# Patient Record
Sex: Female | Born: 1975 | Hispanic: Yes | Marital: Married | Smoking: Never smoker
Health system: Southern US, Community
[De-identification: ages and names within clinical notes are randomized; demographics above are authoritative.]

---

## 2014-07-25 ENCOUNTER — Ambulatory Visit (INDEPENDENT_AMBULATORY_CARE_PROVIDER_SITE_OTHER): Payer: 59 | Admitting: Women's Health

## 2014-07-25 ENCOUNTER — Encounter: Payer: Self-pay | Admitting: Women's Health

## 2014-07-25 ENCOUNTER — Other Ambulatory Visit (HOSPITAL_COMMUNITY)
Admission: RE | Admit: 2014-07-25 | Discharge: 2014-07-25 | Disposition: A | Discharge status: Home / Self Care | Payer: 59 | Source: Ambulatory Visit | Attending: Women's Health | Admitting: Women's Health

## 2014-07-25 VITALS — BP 140/80 | Ht 61.0 in | Wt 120.0 lb

## 2014-07-25 DIAGNOSIS — N979 Female infertility, unspecified: Secondary | ICD-10-CM | POA: Diagnosis not present

## 2014-07-25 DIAGNOSIS — N926 Irregular menstruation, unspecified: Secondary | ICD-10-CM

## 2014-07-25 DIAGNOSIS — N946 Dysmenorrhea, unspecified: Secondary | ICD-10-CM | POA: Diagnosis not present

## 2014-07-25 DIAGNOSIS — Z1151 Encounter for screening for human papillomavirus (HPV): Secondary | ICD-10-CM | POA: Insufficient documentation

## 2014-07-25 DIAGNOSIS — Z833 Family history of diabetes mellitus: Secondary | ICD-10-CM

## 2014-07-25 DIAGNOSIS — Z01419 Encounter for gynecological examination (general) (routine) without abnormal findings: Secondary | ICD-10-CM | POA: Insufficient documentation

## 2014-07-25 DIAGNOSIS — Z13220 Encounter for screening for lipoid disorders: Secondary | ICD-10-CM

## 2014-07-25 DIAGNOSIS — R87810 Cervical high risk human papillomavirus (HPV) DNA test positive: Secondary | ICD-10-CM | POA: Insufficient documentation

## 2014-07-25 LAB — CBC WITH DIFFERENTIAL/PLATELET
Basophils Absolute: 0.1 10*3/uL (ref 0.0–0.1)
Basophils Relative: 1 % (ref 0–1)
Eosinophils Absolute: 0.6 10*3/uL (ref 0.0–0.7)
Eosinophils Relative: 7 % — ABNORMAL HIGH (ref 0–5)
HCT: 38.5 % (ref 36.0–46.0)
HEMOGLOBIN: 13 g/dL (ref 12.0–15.0)
LYMPHS ABS: 2.1 10*3/uL (ref 0.7–4.0)
Lymphocytes Relative: 23 % (ref 12–46)
MCH: 28.7 pg (ref 26.0–34.0)
MCHC: 33.8 g/dL (ref 30.0–36.0)
MCV: 85 fL (ref 78.0–100.0)
MONOS PCT: 8 % (ref 3–12)
MPV: 9.6 fL (ref 8.6–12.4)
Monocytes Absolute: 0.7 10*3/uL (ref 0.1–1.0)
NEUTROS ABS: 5.5 10*3/uL (ref 1.7–7.7)
NEUTROS PCT: 61 % (ref 43–77)
Platelets: 333 10*3/uL (ref 150–400)
RBC: 4.53 MIL/uL (ref 3.87–5.11)
RDW: 14.2 % (ref 11.5–15.5)
WBC: 9 10*3/uL (ref 4.0–10.5)

## 2014-07-25 LAB — LIPID PANEL
CHOLESTEROL: 131 mg/dL (ref 0–200)
HDL: 37 mg/dL — ABNORMAL LOW (ref >=46)
LDL CALC: 79 mg/dL (ref 0–99)
Total CHOL/HDL Ratio: 3.5 Ratio
Triglycerides: 74 mg/dL (ref <150)
VLDL: 15 mg/dL (ref 0–40)

## 2014-07-25 LAB — TSH: TSH: 0.804 u[IU]/mL (ref 0.350–4.500)

## 2014-07-25 LAB — GLUCOSE, RANDOM: Glucose, Bld: 84 mg/dL (ref 70–99)

## 2014-07-25 MED ORDER — DOXYCYCLINE HYCLATE 50 MG PO CAPS: ORAL_CAPSULE | ORAL | Status: DC

## 2014-07-25 MED ORDER — IBUPROFEN 600 MG PO TABS: 600.0000 mg | ORAL_TABLET | Freq: Three times a day (TID) | ORAL | Status: DC | PRN

## 2014-07-25 NOTE — Patient Instructions (Signed)
Health Maintenance Adopting a healthy lifestyle and getting preventive care can go a long way to promote health and wellness. Talk with your health care provider about what schedule of regular examinations is right for you. This is a good chance for you to check in with your provider about disease prevention and staying healthy. In between checkups, there are plenty of things you can do on your own. Experts have done a lot of research about which lifestyle changes and preventive measures are most likely to keep you healthy. Ask your health care provider for more information. WEIGHT AND DIET  Eat a healthy diet  Be sure to include plenty of vegetables, fruits, low-fat dairy products, and lean protein.  Do not eat a lot of foods high in solid fats, added sugars, or salt.  Get regular exercise. This is one of the most important things you can do for your health.  Most adults should exercise for at least 150 minutes each week. The exercise should increase your heart rate and make you sweat (moderate-intensity exercise).  Most adults should also do strengthening exercises at least twice a week. This is in addition to the moderate-intensity exercise.  Maintain a healthy weight  Body mass index (BMI) is a measurement that can be used to identify possible weight problems. It estimates body fat based on height and weight. Your health care provider can help determine your BMI and help you achieve or maintain a healthy weight.  For females 25 years of age and older:   A BMI below 18.5 is considered underweight.  A BMI of 18.5 to 24.9 is normal.  A BMI of 25 to 29.9 is considered overweight.  A BMI of 30 and above is considered obese.  Watch levels of cholesterol and blood lipids  You should start having your blood tested for lipids and cholesterol at 39 years of age, then have this test every 5 years.  You may need to have your cholesterol levels checked more often if:  Your lipid or  cholesterol levels are high.  You are older than 39 years of age.  You are at high risk for heart disease.  CANCER SCREENING   Lung Cancer  Lung cancer screening is recommended for adults 97-92 years old who are at high risk for lung cancer because of a history of smoking.  A yearly low-dose CT scan of the lungs is recommended for people who:  Currently smoke.  Have quit within the past 15 years.  Have at least a 30-pack-year history of smoking. A pack year is smoking an average of one pack of cigarettes a day for 1 year.  Yearly screening should continue until it has been 15 years since you quit.  Yearly screening should stop if you develop a health problem that would prevent you from having lung cancer treatment.  Breast Cancer  Practice breast self-awareness. This means understanding how your breasts normally appear and feel.  It also means doing regular breast self-exams. Let your health care provider know about any changes, no matter how small.  If you are in your 20s or 30s, you should have a clinical breast exam (CBE) by a health care provider every 1-3 years as part of a regular health exam.  If you are 76 or older, have a CBE every year. Also consider having a breast X-ray (mammogram) every year.  If you have a family history of breast cancer, talk to your health care provider about genetic screening.  If you are  at high risk for breast cancer, talk to your health care provider about having an MRI and a mammogram every year.  Breast cancer gene (BRCA) assessment is recommended for women who have family members with BRCA-related cancers. BRCA-related cancers include:  Breast.  Ovarian.  Tubal.  Peritoneal cancers.  Results of the assessment will determine the need for genetic counseling and BRCA1 and BRCA2 testing. Cervical Cancer Routine pelvic examinations to screen for cervical cancer are no longer recommended for nonpregnant women who are considered low  risk for cancer of the pelvic organs (ovaries, uterus, and vagina) and who do not have symptoms. A pelvic examination may be necessary if you have symptoms including those associated with pelvic infections. Ask your health care provider if a screening pelvic exam is right for you.   The Pap test is the screening test for cervical cancer for women who are considered at risk.  If you had a hysterectomy for a problem that was not cancer or a condition that could lead to cancer, then you no longer need Pap tests.  If you are older than 65 years, and you have had normal Pap tests for the past 10 years, you no longer need to have Pap tests.  If you have had past treatment for cervical cancer or a condition that could lead to cancer, you need Pap tests and screening for cancer for at least 20 years after your treatment.  If you no longer get a Pap test, assess your risk factors if they change (such as having a new sexual partner). This can affect whether you should start being screened again.  Some women have medical problems that increase their chance of getting cervical cancer. If this is the case for you, your health care provider may recommend more frequent screening and Pap tests.  The human papillomavirus (HPV) test is another test that may be used for cervical cancer screening. The HPV test looks for the virus that can cause cell changes in the cervix. The cells collected during the Pap test can be tested for HPV.  The HPV test can be used to screen women 30 years of age and older. Getting tested for HPV can extend the interval between normal Pap tests from three to five years.  An HPV test also should be used to screen women of any age who have unclear Pap test results.  After 39 years of age, women should have HPV testing as often as Pap tests.  Colorectal Cancer  This type of cancer can be detected and often prevented.  Routine colorectal cancer screening usually begins at 39 years of  age and continues through 39 years of age.  Your health care provider may recommend screening at an earlier age if you have risk factors for colon cancer.  Your health care provider may also recommend using home test kits to check for hidden blood in the stool.  A small camera at the end of a tube can be used to examine your colon directly (sigmoidoscopy or colonoscopy). This is done to check for the earliest forms of colorectal cancer.  Routine screening usually begins at age 50.  Direct examination of the colon should be repeated every 5-10 years through 39 years of age. However, you may need to be screened more often if early forms of precancerous polyps or small growths are found. Skin Cancer  Check your skin from head to toe regularly.  Tell your health care provider about any new moles or changes in   moles, especially if there is a change in a mole's shape or color.  Also tell your health care provider if you have a mole that is larger than the size of a pencil eraser.  Always use sunscreen. Apply sunscreen liberally and repeatedly throughout the day.  Protect yourself by wearing long sleeves, pants, a wide-brimmed hat, and sunglasses whenever you are outside. HEART DISEASE, DIABETES, AND HIGH BLOOD PRESSURE   Have your blood pressure checked at least every 1-2 years. High blood pressure causes heart disease and increases the risk of stroke.  If you are between 75 years and 42 years old, ask your health care provider if you should take aspirin to prevent strokes.  Have regular diabetes screenings. This involves taking a blood sample to check your fasting blood sugar level.  If you are at a normal weight and have a low risk for diabetes, have this test once every three years after 39 years of age.  If you are overweight and have a high risk for diabetes, consider being tested at a younger age or more often. PREVENTING INFECTION  Hepatitis B  If you have a higher risk for  hepatitis B, you should be screened for this virus. You are considered at high risk for hepatitis B if:  You were born in a country where hepatitis B is common. Ask your health care provider which countries are considered high risk.  Your parents were born in a high-risk country, and you have not been immunized against hepatitis B (hepatitis B vaccine).  You have HIV or AIDS.  You use needles to inject street drugs.  You live with someone who has hepatitis B.  You have had sex with someone who has hepatitis B.  You get hemodialysis treatment.  You take certain medicines for conditions, including cancer, organ transplantation, and autoimmune conditions. Hepatitis C  Blood testing is recommended for:  Everyone born from [birth year] through 1965.  Anyone with known risk factors for hepatitis C. Sexually transmitted infections (STIs)  You should be screened for sexually transmitted infections (STIs) including gonorrhea and chlamydia if:  You are sexually active and are younger than 39 years of age.  You are older than 39 years of age and your health care provider tells you that you are at risk for this type of infection.  Your sexual activity has changed since you were last screened and you are at an increased risk for chlamydia or gonorrhea. Ask your health care provider if you are at risk.  If you do not have HIV, but are at risk, it may be recommended that you take a prescription medicine daily to prevent HIV infection. This is called pre-exposure prophylaxis (PrEP). You are considered at risk if:  You are sexually active and do not regularly use condoms or know the HIV status of your partner(s).  You take drugs by injection.  You are sexually active with a partner who has HIV. Talk with your health care provider about whether you are at high risk of being infected with HIV. If you choose to begin PrEP, you should first be tested for HIV. You should then be tested every 3 months for  as long as you are taking PrEP.  PREGNANCY   If you are premenopausal and you may become pregnant, ask your health care provider about preconception counseling.  If you may become pregnant, take 400 to 800 micrograms (mcg) of folic acid every day.  If you want to prevent pregnancy, talk to your  health care provider about birth control (contraception). OSTEOPOROSIS AND MENOPAUSE   Osteoporosis is a disease in which the bones lose minerals and strength with aging. This can result in serious bone fractures. Your risk for osteoporosis can be identified using a bone density scan.  If you are 73 years of age or older, or if you are at risk for osteoporosis and fractures, ask your health care provider if you should be screened.  Ask your health care provider whether you should take a calcium or vitamin D supplement to lower your risk for osteoporosis.  Menopause may have certain physical symptoms and risks.  Hormone replacement therapy may reduce some of these symptoms and risks. Talk to your health care provider about whether hormone replacement therapy is right for you.  HOME CARE INSTRUCTIONS   Schedule regular health, dental, and eye exams.  Stay current with your immunizations.   Do not use any tobacco products including cigarettes, chewing tobacco, or electronic cigarettes.  If you are pregnant, do not drink alcohol.  If you are breastfeeding, limit how much and how often you drink alcohol.  Limit alcohol intake to no more than 1 drink per day for nonpregnant women. One drink equals 12 ounces of beer, 5 ounces of wine, or 1 ounces of hard liquor.  Do not use street drugs.  Do not share needles.  Ask your health care provider for help if you need support or information about quitting drugs.  Tell your health care provider if you often feel depressed.  Tell your health care provider if you have ever been abused or do not feel safe at home. Document Released: 10/19/2010  Document Revised: 08/20/2013 Document Reviewed: 03/07/2013 [facility] Patient Information 2015 [place], [place]. This information is not intended to replace advice given to you by your health care provider. Make sure you discuss any questions you have with your health care provider. Hysterosalpingography Hysterosalpingography is a procedure to look inside your uterus and fallopian tubes. During this procedure, contrast dye is injected into your uterus through your vagina and cervix to illuminate your uterus while X-ray pictures are taken. This procedure may help your health care provider determine whether you have uterine tumors, adhesions, or structural abnormalities. It is commonly used to help determine why a woman is unable to have children (infertility). The procedure usually lasts about 15-30 minutes. LET [facility] CARE PROVIDER KNOW ABOUT:  Any allergies you have.  All medicines you are taking, including vitamins, herbs, eye drops, creams, and over-the-counter medicines.  Previous problems you or members of your family have had with the use of anesthetics.  Any blood disorders you have.  Previous surgeries you have had.  Medical conditions you have. RISKS AND COMPLICATIONS  Generally, this is a safe procedure. However, as with any procedure, problems can occur. Possible problems include:  Infection in the lining of the uterus (endometritis) or fallopian tubes (salpingitis).  Damage or perforation of the uterus or fallopian tubes.  An allergic reaction to the contrast dye used to perform the X-ray. BEFORE THE PROCEDURE   Schedule the procedure after your period stops, but before your next ovulation. This is usually between day 5 and day 10 of your last period. Day 1 is the first day of your period.  Ask your health care provider about changing or stopping your regular medicines.  You may eat and drink as normal.  Empty your bladder before the procedure begins. PROCEDURE  You  may be given a medicine to relax you (  sedative) or an over-the-counter pain medicine to lessen any discomfort during the procedure.  You will lie down on an X-ray table with your feet in stirrups.  A device called a speculum will be placed into your vagina. This allows your health care provider to see inside your vagina to the cervix.  The cervix will be washed with a special soap.  A thin, flexible tube will be passed through the cervix into the uterus.  Contrast dye will be put into this tube.  Several X-rays will be taken as the contrast dye spreads through the uterus and fallopian tubes.  The tube will be taken out after the procedure. AFTER THE PROCEDURE   Most of the contrast dye will flow out of your vagina naturally. You may want to wear a sanitary pad.  You may feel mild cramping and notice a little bleeding from your vagina. This should go away in 24 hours.  Ask when your test results will be ready. Make sure you get your test results. Document Released: 05/08/2004 Document Revised: 04/10/2013 Document Reviewed: 10/06/2012 [facility] Patient Information 2015 [place], [place]. This information is not intended to replace advice given to you by your health care provider. Make sure you discuss any questions you have with your health care provider.

## 2014-07-25 NOTE — Progress Notes (Signed)
Margaret Mullen [birth year] [id number]    History:    Presents for annual exam.   Regular monthly cycle , heavy flow with dysmenorrhea. No contraception greater than 10 years without pregnancy. Desires pregnancy but has not pursued fertility management. Husband has a 39 year old daughter. daughter.  Reports normal Pap history, recently moved here from [facility] , originally from [place].   Past medical history, past surgical history, family history and social history were all reviewed and documented in the EPIC chart.  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 Parents healthy.  ROS:  A ROS was performed and pertinent positives and negatives are included.  Exam:  Filed Vitals:   07/25/14 1437  BP: 140/80    General appearance:  Normal Thyroid:  Symmetrical, normal in size, without palpable masses or nodularity. Respiratory  Auscultation:  Clear without wheezing or rhonchi Cardiovascular  Auscultation:  Regular rate, without rubs, murmurs or gallops  Edema/varicosities:  Not grossly evident Abdominal  Soft,nontender, without masses, guarding or rebound.  Liver/spleen:  No organomegaly noted  Hernia:  None appreciated  Skin  Inspection:  Grossly normal   Breasts: Examined lying and sitting.     Right: Without masses, retractions, discharge or axillary adenopathy.     Left: Without masses, retractions, discharge or axillary adenopathy. Gentitourinary   Inguinal/mons:  Normal without inguinal adenopathy  External genitalia:  Normal  BUS/Urethra/Skene's glands:  Normal  Vagina:  Normal  Cervix:  Normal  Uterus:  normal in size, shape and contour.  Midline and mobile  Adnexa/parametria:     Rt: Without masses or tenderness.   Lt: Without masses or tenderness.  Anus and perineum: Normal  Digital rectal exam: Normal sphincter tone without palpated masses or tenderness  Assessment/Plan:  39 y.o. MHF G0 for annual exam.     Dysmenorrhea  Primary infertility   Plan: CBC, TSH, prolactin, glucose,  lipid panel,  UA, Pap with HR HPV typing, new screening guidelines reviewed. Hysterosalpingogram, will schedule, doxycycline 100 twice daily day before, day of and day after procedure.  Instructed to call office first day of next cycle. Motrin 600 every 8 hours as needed for pain. Fertility reviewed, increase frequency of intercourse, discussed referral for infertility management.  Semen analysis. SBE's, regular exercise, calcium rich diet, MVI daily encouraged. Safe pregnancy behaviors reviewed.    Huel Cote WHNP, 5:20 PM 07/25/2014

## 2014-07-26 LAB — URINALYSIS, ROUTINE W REFLEX MICROSCOPIC
Bilirubin Urine: NEGATIVE
Glucose, UA: NEGATIVE mg/dL
Hgb urine dipstick: NEGATIVE
KETONES UR: NEGATIVE mg/dL
Leukocytes, UA: NEGATIVE
NITRITE: NEGATIVE
PH: 7.5 (ref 5.0–8.0)
Protein, ur: NEGATIVE mg/dL
Specific Gravity, Urine: 1.015 (ref 1.005–1.030)
UROBILINOGEN UA: 0.2 mg/dL (ref 0.0–1.0)

## 2014-07-26 LAB — PROLACTIN: PROLACTIN: 14.4 ng/mL

## 2014-07-29 LAB — CYTOLOGY - PAP

## 2014-07-31 ENCOUNTER — Encounter: Payer: Self-pay | Admitting: Women's Health

## 2014-10-12 ENCOUNTER — Encounter: Payer: Self-pay | Admitting: Women's Health

## 2014-10-25 ENCOUNTER — Ambulatory Visit (INDEPENDENT_AMBULATORY_CARE_PROVIDER_SITE_OTHER): Payer: 59 | Admitting: Women's Health

## 2014-10-25 ENCOUNTER — Encounter: Payer: Self-pay | Admitting: Women's Health

## 2014-10-25 DIAGNOSIS — N912 Amenorrhea, unspecified: Secondary | ICD-10-CM

## 2014-10-25 DIAGNOSIS — B3731 Acute candidiasis of vulva and vagina: Secondary | ICD-10-CM

## 2014-10-25 DIAGNOSIS — N898 Other specified noninflammatory disorders of vagina: Secondary | ICD-10-CM

## 2014-10-25 DIAGNOSIS — B373 Candidiasis of vulva and vagina: Secondary | ICD-10-CM | POA: Diagnosis not present

## 2014-10-25 LAB — WET PREP FOR TRICH, YEAST, CLUE
Clue Cells Wet Prep HPF POC: NONE SEEN
TRICH WET PREP: NONE SEEN

## 2014-10-25 LAB — PREGNANCY, URINE: PREG TEST UR: NEGATIVE

## 2014-10-25 MED ORDER — FLUCONAZOLE 150 MG PO TABS: 150.0000 mg | ORAL_TABLET | Freq: Once | ORAL | Status: DC

## 2014-10-25 MED ORDER — MEDROXYPROGESTERONE ACETATE 10 MG PO TABS: 10.0000 mg | ORAL_TABLET | Freq: Every day | ORAL | Status: DC

## 2014-10-25 NOTE — Progress Notes (Signed)
Patient ID: Margaret Mullen, female   DOB: [birth year], 39 y.o.   MRN: [id number] Presents with amenorrhea, LMP 09/02/2014 normal cycle. Always regular monthly every 21-23 day cycles, first missed cycle. Uses no contraception history of primary infertility. 5 negative home UPT's 1 questionable positive. Denies urinary symptoms, abdominal pain, discharge, or any pregnancy or menopausal symptoms. Normal TSH and prolactin 07/2014.  Exam: Appears well. External genitalia within normal limits, speculum exam moderate amount of a white discharge, wet prep positive for yeast. Bimanual uterus small nontender no adnexal fullness or tenderness. UPT negative.  Amenorrhea Yeast vaginitis  Plan: FSH, qualitative hCG, Provera 10 for 5 days if hCG negative. Diflucan 150 by mouth 1 dose prescription, proper use given and reviewed. Continue keeping menstrual calendar.

## 2014-10-25 NOTE — Patient Instructions (Signed)
Secondary Amenorrhea  Secondary amenorrhea is the stopping of menstrual flow for 3-6 months in a female who has previously had periods. There are many possible causes. Most of these causes are not serious. Usually, treating the underlying problem causing the loss of menses will return your periods to normal. CAUSES  Some common and uncommon causes of not menstruating include:  Malnutrition.  Low blood sugar (hypoglycemia).  Polycystic ovary disease.  Stress or fear.  Breastfeeding.  Hormone imbalance.  Ovarian failure.  Medicines.  Extreme obesity.  Cystic fibrosis.  Low body weight or drastic weight reduction from any cause.  Early menopause.  Removal of ovaries or uterus.  Contraceptives.  Illness.  Long-term (chronic) illnesses.  Cushing syndrome.  Thyroid problems.  Birth control pills, patches, or vaginal rings for birth control. RISK FACTORS You may be at greater risk of secondary amenorrhea if:  You have a family history of this condition.  You have an eating disorder.  You do athletic training. DIAGNOSIS  A diagnosis is made by your health care provider taking a medical history and doing a physical exam. This will include a pelvic exam to check for problems with your reproductive organs. Pregnancy must be ruled out. Often, numerous blood tests are done to measure different hormones in the body. Urine testing may be done. Specialized exams (ultrasound, CT scan, MRI, or hysteroscopy) may have to be done as well as measuring the body mass index (BMI). TREATMENT  Treatment depends on the cause of the amenorrhea. If an eating disorder is present, this can be treated with an adequate diet and therapy. Chronic illnesses may improve with treatment of the illness. Amenorrhea may be corrected with medicines, lifestyle changes, or surgery. If the amenorrhea cannot be corrected, it is sometimes possible to create a false menstruation with medicines. HOME CARE  INSTRUCTIONS  Maintain a healthy diet.  Manage weight problems.  Exercise regularly but not excessively.  Get adequate sleep.  Manage stress.  Be aware of changes in your menstrual cycle. Keep a record of when your periods occur. Note the date your period starts, how long it lasts, and any problems. SEEK MEDICAL CARE IF: Your symptoms do not get better with treatment. Document Released: 05/17/2006 Document Revised: 12/06/2012 Document Reviewed: 09/21/2012 [facility] Patient Information 2015 [place], [place]. This information is not intended to replace advice given to you by your health care provider. Make sure you discuss any questions you have with your health care provider.

## 2014-10-26 LAB — FOLLICLE STIMULATING HORMONE: FSH: 7 m[IU]/mL

## 2014-10-26 LAB — HCG, QUANTITATIVE, PREGNANCY: hCG, Beta Chain, Quant, S: <2 m[IU]/mL

## 2015-02-25 ENCOUNTER — Encounter: Payer: Self-pay | Admitting: Women's Health

## 2015-03-04 ENCOUNTER — Ambulatory Visit (INDEPENDENT_AMBULATORY_CARE_PROVIDER_SITE_OTHER): Payer: 59 | Admitting: Women's Health

## 2015-03-04 ENCOUNTER — Encounter: Payer: Self-pay | Admitting: Women's Health

## 2015-03-04 VITALS — BP 134/80 | Temp 98.1°F | Ht 61.0 in | Wt 120.0 lb

## 2015-03-04 DIAGNOSIS — B373 Candidiasis of vulva and vagina: Secondary | ICD-10-CM

## 2015-03-04 DIAGNOSIS — R350 Frequency of micturition: Secondary | ICD-10-CM | POA: Diagnosis not present

## 2015-03-04 DIAGNOSIS — N898 Other specified noninflammatory disorders of vagina: Secondary | ICD-10-CM

## 2015-03-04 DIAGNOSIS — B3731 Acute candidiasis of vulva and vagina: Secondary | ICD-10-CM

## 2015-03-04 LAB — WET PREP FOR TRICH, YEAST, CLUE
Clue Cells Wet Prep HPF POC: NONE SEEN
TRICH WET PREP: NONE SEEN

## 2015-03-04 LAB — URINALYSIS W MICROSCOPIC + REFLEX CULTURE
Bilirubin Urine: NEGATIVE
Casts: NONE SEEN [LPF]
Crystals: NONE SEEN [HPF]
GLUCOSE, UA: NEGATIVE
KETONES UR: NEGATIVE
LEUKOCYTES UA: NEGATIVE
Nitrite: NEGATIVE
PH: 5.5 (ref 5.0–8.0)
Protein, ur: NEGATIVE
Specific Gravity, Urine: >1.03 (ref 1.001–1.035)
WBC UA: NONE SEEN WBC/HPF (ref <=5)
Yeast: NONE SEEN [HPF]

## 2015-03-04 MED ORDER — FLUCONAZOLE 100 MG PO TABS: 100.0000 mg | ORAL_TABLET | Freq: Every day | ORAL | Status: DC

## 2015-03-04 NOTE — Progress Notes (Signed)
Patient ID: Margaret Mullen, female   DOB: July 30, 1975, 39 y.o.   MRN: [id number] Presents with complaint of increased vaginal irritation with itching, poor sleep due to itching, no relief with over-the-counter Monistat. Reports  relief with Diflucan,  within several days symptoms returned when used 2 weeks ago.. Mild urinary frequency without pain or burning. Denies abdominal pain or fever. Monthly cycle, no contraception pregnancy desired, no contraception for greater than 10 years, declines fertility intervention.   Exam: Appears well. Abdomen soft nontender, external genitalia mild erythema at introitus, speculum exam vaginal walls, wet prep positive for yeast. Bimanual no adnexal tenderness no CMT.  Yeast vaginitis  Plan: Diflucan 100, 2 tablets today, repeat in 3 days. Yeast prevention discussed. Call if no relief. UA pending.

## 2015-03-04 NOTE — Patient Instructions (Signed)

## 2015-03-05 LAB — URINE CULTURE
Colony Count: NO GROWTH
Organism ID, Bacteria: NO GROWTH

## 2015-03-06 ENCOUNTER — Ambulatory Visit: Payer: 59 | Admitting: Women's Health

## 2015-03-06 ENCOUNTER — Encounter: Payer: Self-pay | Admitting: Women's Health

## 2015-03-30 ENCOUNTER — Encounter: Payer: Self-pay | Admitting: Women's Health

## 2015-03-31 ENCOUNTER — Other Ambulatory Visit: Payer: Self-pay | Admitting: Women's Health

## 2015-03-31 MED ORDER — TERCONAZOLE 0.4 % VA CREA: 1.0000 | TOPICAL_CREAM | Freq: Every day | VAGINAL | Status: DC

## 2015-07-31 ENCOUNTER — Encounter: Payer: Self-pay | Admitting: Women's Health

## 2015-07-31 ENCOUNTER — Ambulatory Visit (INDEPENDENT_AMBULATORY_CARE_PROVIDER_SITE_OTHER): Payer: 59 | Admitting: Women's Health

## 2015-07-31 VITALS — BP 132/80 | Ht 61.0 in | Wt 122.0 lb

## 2015-07-31 DIAGNOSIS — Z01419 Encounter for gynecological examination (general) (routine) without abnormal findings: Secondary | ICD-10-CM

## 2015-07-31 DIAGNOSIS — N852 Hypertrophy of uterus: Secondary | ICD-10-CM | POA: Diagnosis not present

## 2015-07-31 DIAGNOSIS — B373 Candidiasis of vulva and vagina: Secondary | ICD-10-CM

## 2015-07-31 DIAGNOSIS — B3731 Acute candidiasis of vulva and vagina: Secondary | ICD-10-CM

## 2015-07-31 LAB — CBC WITH DIFFERENTIAL/PLATELET
BASOS ABS: 83 {cells}/uL (ref 0–200)
Basophils Relative: 1 %
EOS ABS: 415 {cells}/uL (ref 15–500)
Eosinophils Relative: 5 %
HEMATOCRIT: 36.8 % (ref 35.0–45.0)
HEMOGLOBIN: 12.3 g/dL (ref 11.7–15.5)
LYMPHS ABS: 2324 {cells}/uL (ref 850–3900)
Lymphocytes Relative: 28 %
MCH: 27.6 pg (ref 27.0–33.0)
MCHC: 33.4 g/dL (ref 32.0–36.0)
MCV: 82.5 fL (ref 80.0–100.0)
MONO ABS: 747 {cells}/uL (ref 200–950)
MPV: 9.2 fL (ref 7.5–12.5)
Monocytes Relative: 9 %
NEUTROS ABS: 4731 {cells}/uL (ref 1500–7800)
NEUTROS PCT: 57 %
Platelets: 437 10*3/uL — ABNORMAL HIGH (ref 140–400)
RBC: 4.46 MIL/uL (ref 3.80–5.10)
RDW: 14.3 % (ref 11.0–15.0)
WBC: 8.3 10*3/uL (ref 3.8–10.8)

## 2015-07-31 LAB — WET PREP FOR TRICH, YEAST, CLUE
Clue Cells Wet Prep HPF POC: NONE SEEN
Trich, Wet Prep: NONE SEEN
WBC, Wet Prep HPF POC: NONE SEEN
YEAST WET PREP: NONE SEEN

## 2015-07-31 MED ORDER — FLUCONAZOLE 100 MG PO TABS: 100.0000 mg | ORAL_TABLET | Freq: Every day | ORAL | Status: DC

## 2015-07-31 NOTE — Addendum Note (Signed)
Addended by: Burnett Kanaris on: 07/31/2015 04:01 PM   Modules accepted: Orders

## 2015-07-31 NOTE — Progress Notes (Signed)
Margaret Mullen [birth year] [id number]    History:    Presents for annual exam.  Regular monthly cycle using no contraception for greater than 10 years without pregnancy. Declines infertility consult. Normal labs. 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  Past medical history, past surgical history, family history and social history were all reviewed and documented in the EPIC chart. Works at an [employer]. Moved here from [place] originally from [place]. 16 annual stepdaughter possibly coming to live full-time with her. Parents healthy.  ROS:  A ROS was performed and pertinent positives and negatives are included.  Exam:  Filed Vitals:   07/31/15 1516  BP: 132/80    General appearance:  Normal Thyroid:  Symmetrical, normal in size, without palpable masses or nodularity. Respiratory  Auscultation:  Clear without wheezing or rhonchi Cardiovascular  Auscultation:  Regular rate, without rubs, murmurs or gallops  Edema/varicosities:  Not grossly evident Abdominal  Soft,nontender, without masses, guarding or rebound.  Liver/spleen:  No organomegaly noted  Hernia:  None appreciated  Skin  Inspection:  Grossly normal   Breasts: Examined lying and sitting.     Right: Without masses, retractions, discharge or axillary adenopathy.     Left: Without masses, retractions, discharge or axillary adenopathy. Gentitourinary   Inguinal/mons:  Normal without inguinal adenopathy  External genitalia:  Normal  BUS/Urethra/Skene's glands:  Normal  Vagina:  Normal  Cervix:  Normal  Uterus:  8 weeks' size uterus shape and contour.  Midline and mobile  Adnexa/parametria:     Rt: Without masses or tenderness.   Lt: Without masses or tenderness.  Anus and perineum: Normal  Digital rectal exam: Normal sphincter tone without palpated masses or tenderness  Assessment/Plan:  40 y.o. MHF G0 for annual exam with complaint of vaginal itching after menses.  Monthly cycle/no  contraception pregnancy okay declines intervention Recurrent yeast after menses Enlarged uterus  Plan: Schedule ultrasound. SBE's, annual screening mammogram at 72, breast center information given and reviewed.  Reviewed importance of increasing regular exercise, calcium rich diet, MVI daily. Return to office with missed cycle. Diflucan 200 mg by mouth with cycle. Call or return if no relief of itching. Wet prep negative today. CBC, UA, Pap with HR HPV typing.   [facility], 3:42 PM 07/31/2015

## 2015-07-31 NOTE — Patient Instructions (Signed)
Health Maintenance, Female Adopting a healthy lifestyle and getting preventive care can go a long way to promote health and wellness. Talk with your health care provider about what schedule of regular examinations is right for you. This is a good chance for you to check in with your provider about disease prevention and staying healthy. In between checkups, there are plenty of things you can do on your own. Experts have done a lot of research about which lifestyle changes and preventive measures are most likely to keep you healthy. Ask your health care provider for more information. WEIGHT AND DIET  Eat a healthy diet  Be sure to include plenty of vegetables, fruits, low-fat dairy products, and lean protein.  Do not eat a lot of foods high in solid fats, added sugars, or salt.  Get regular exercise. This is one of the most important things you can do for your health.  Most adults should exercise for at least 150 minutes each week. The exercise should increase your heart rate and make you sweat (moderate-intensity exercise).  Most adults should also do strengthening exercises at least twice a week. This is in addition to the moderate-intensity exercise.  Maintain a healthy weight  Body mass index (BMI) is a measurement that can be used to identify possible weight problems. It estimates body fat based on height and weight. Your health care provider can help determine your BMI and help you achieve or maintain a healthy weight.  For females 20 years of age and older:   A BMI below 18.5 is considered underweight.  A BMI of 18.5 to 24.9 is normal.  A BMI of 25 to 29.9 is considered overweight.  A BMI of 30 and above is considered obese.  Watch levels of cholesterol and blood lipids  You should start having your blood tested for lipids and cholesterol at 40 years of age, then have this test every 5 years.  You may need to have your cholesterol levels checked more often if:  Your lipid  or cholesterol levels are high.  You are older than 40 years of age.  You are at high risk for heart disease.  CANCER SCREENING   Lung Cancer  Lung cancer screening is recommended for adults 55-80 years old who are at high risk for lung cancer because of a history of smoking.  A yearly low-dose CT scan of the lungs is recommended for people who:  Currently smoke.  Have quit within the past 15 years.  Have at least a 30-pack-year history of smoking. A pack year is smoking an average of one pack of cigarettes a day for 1 year.  Yearly screening should continue until it has been 15 years since you quit.  Yearly screening should stop if you develop a health problem that would prevent you from having lung cancer treatment.  Breast Cancer  Practice breast self-awareness. This means understanding how your breasts normally appear and feel.  It also means doing regular breast self-exams. Let your health care provider know about any changes, no matter how small.  If you are in your 20s or 30s, you should have a clinical breast exam (CBE) by a health care provider every 1-3 years as part of a regular health exam.  If you are 40 or older, have a CBE every year. Also consider having a breast X-ray (mammogram) every year.  If you have a family history of breast cancer, talk to your health care provider about genetic screening.  If you   are at high risk for breast cancer, talk to your health care provider about having an MRI and a mammogram every year.  Breast cancer gene (BRCA) assessment is recommended for women who have family members with BRCA-related cancers. BRCA-related cancers include:  Breast.  Ovarian.  Tubal.  Peritoneal cancers.  Results of the assessment will determine the need for genetic counseling and BRCA1 and BRCA2 testing. Cervical Cancer Your health care provider may recommend that you be screened regularly for cancer of the pelvic organs (ovaries, uterus, and  vagina). This screening involves a pelvic examination, including checking for microscopic changes to the surface of your cervix (Pap test). You may be encouraged to have this screening done every 3 years, beginning at age 21.  For women ages 30-65, health care providers may recommend pelvic exams and Pap testing every 3 years, or they may recommend the Pap and pelvic exam, combined with testing for human papilloma virus (HPV), every 5 years. Some types of HPV increase your risk of cervical cancer. Testing for HPV may also be done on women of any age with unclear Pap test results.  Other health care providers may not recommend any screening for nonpregnant women who are considered low risk for pelvic cancer and who do not have symptoms. Ask your health care provider if a screening pelvic exam is right for you.  If you have had past treatment for cervical cancer or a condition that could lead to cancer, you need Pap tests and screening for cancer for at least 20 years after your treatment. If Pap tests have been discontinued, your risk factors (such as having a new sexual partner) need to be reassessed to determine if screening should resume. Some women have medical problems that increase the chance of getting cervical cancer. In these cases, your health care provider may recommend more frequent screening and Pap tests. Colorectal Cancer  This type of cancer can be detected and often prevented.  Routine colorectal cancer screening usually begins at 40 years of age and continues through 40 years of age.  Your health care provider may recommend screening at an earlier age if you have risk factors for colon cancer.  Your health care provider may also recommend using home test kits to check for hidden blood in the stool.  A small camera at the end of a tube can be used to examine your colon directly (sigmoidoscopy or colonoscopy). This is done to check for the earliest forms of colorectal  cancer.  Routine screening usually begins at age 50.  Direct examination of the colon should be repeated every 5-10 years through 40 years of age. However, you may need to be screened more often if early forms of precancerous polyps or small growths are found. Skin Cancer  Check your skin from head to toe regularly.  Tell your health care provider about any new moles or changes in moles, especially if there is a change in a mole's shape or color.  Also tell your health care provider if you have a mole that is larger than the size of a pencil eraser.  Always use sunscreen. Apply sunscreen liberally and repeatedly throughout the day.  Protect yourself by wearing long sleeves, pants, a wide-brimmed hat, and sunglasses whenever you are outside. HEART DISEASE, DIABETES, AND HIGH BLOOD PRESSURE   High blood pressure causes heart disease and increases the risk of stroke. High blood pressure is more likely to develop in:  People who have blood pressure in the high end   of the normal range (130-139/85-89 mm Hg).  People who are overweight or obese.  People who are African American.  If you are 38-23 years of age, have your blood pressure checked every 3-5 years. If you are 61 years of age or older, have your blood pressure checked every year. You should have your blood pressure measured twice--once when you are at a hospital or clinic, and once when you are not at a hospital or clinic. Record the average of the two measurements. To check your blood pressure when you are not at a hospital or clinic, you can use:  An automated blood pressure machine at a pharmacy.  A home blood pressure monitor.  If you are between 45 years and 39 years old, ask your health care provider if you should take aspirin to prevent strokes.  Have regular diabetes screenings. This involves taking a blood sample to check your fasting blood sugar level.  If you are at a normal weight and have a low risk for diabetes,  have this test once every three years after 40 years of age.  If you are overweight and have a high risk for diabetes, consider being tested at a younger age or more often. PREVENTING INFECTION  Hepatitis B  If you have a higher risk for hepatitis B, you should be screened for this virus. You are considered at high risk for hepatitis B if:  You were born in a country where hepatitis B is common. Ask your health care provider which countries are considered high risk.  Your parents were born in a high-risk country, and you have not been immunized against hepatitis B (hepatitis B vaccine).  You have HIV or AIDS.  You use needles to inject street drugs.  You live with someone who has hepatitis B.  You have had sex with someone who has hepatitis B.  You get hemodialysis treatment.  You take certain medicines for conditions, including cancer, organ transplantation, and autoimmune conditions. Hepatitis C  Blood testing is recommended for:  Everyone born from [birth year] through 1965.  Anyone with known risk factors for hepatitis C. Sexually transmitted infections (STIs)  You should be screened for sexually transmitted infections (STIs) including gonorrhea and chlamydia if:  You are sexually active and are younger than 40 years of age.  You are older than 40 years of age and your health care provider tells you that you are at risk for this type of infection.  Your sexual activity has changed since you were last screened and you are at an increased risk for chlamydia or gonorrhea. Ask your health care provider if you are at risk.  If you do not have HIV, but are at risk, it may be recommended that you take a prescription medicine daily to prevent HIV infection. This is called pre-exposure prophylaxis (PrEP). You are considered at risk if:  You are sexually active and do not regularly use condoms or know the HIV status of your partner(s).  You take drugs by injection.  You are sexually  active with a partner who has HIV. Talk with your health care provider about whether you are at high risk of being infected with HIV. If you choose to begin PrEP, you should first be tested for HIV. You should then be tested every 3 months for as long as you are taking PrEP.  PREGNANCY   If you are premenopausal and you may become pregnant, ask your health care provider about preconception counseling.  If you may  become pregnant, take 400 to 800 micrograms (mcg) of folic acid every day.  If you want to prevent pregnancy, talk to your health care provider about birth control (contraception). OSTEOPOROSIS AND MENOPAUSE   Osteoporosis is a disease in which the bones lose minerals and strength with aging. This can result in serious bone fractures. Your risk for osteoporosis can be identified using a bone density scan.  If you are 61 years of age or older, or if you are at risk for osteoporosis and fractures, ask your health care provider if you should be screened.  Ask your health care provider whether you should take a calcium or vitamin D supplement to lower your risk for osteoporosis.  Menopause may have certain physical symptoms and risks.  Hormone replacement therapy may reduce some of these symptoms and risks. Talk to your health care provider about whether hormone replacement therapy is right for you.  HOME CARE INSTRUCTIONS   Schedule regular health, dental, and eye exams.  Stay current with your immunizations.   Do not use any tobacco products including cigarettes, chewing tobacco, or electronic cigarettes.  If you are pregnant, do not drink alcohol.  If you are breastfeeding, limit how much and how often you drink alcohol.  Limit alcohol intake to no more than 1 drink per day for nonpregnant women. One drink equals 12 ounces of beer, 5 ounces of wine, or 1 ounces of hard liquor.  Do not use street drugs.  Do not share needles.  Ask your health care provider for help if  you need support or information about quitting drugs.  Tell your health care provider if you often feel depressed.  Tell your health care provider if you have ever been abused or do not feel safe at home.   This information is not intended to replace advice given to you by your health care provider. Make sure you discuss any questions you have with your health care provider.   Document Released: 10/19/2010 Document Revised: 04/26/2014 Document Reviewed: 03/07/2013 Elsevier Interactive Patient Education [employer].

## 2015-08-01 LAB — URINALYSIS W MICROSCOPIC + REFLEX CULTURE
BACTERIA UA: NONE SEEN [HPF]
Bilirubin Urine: NEGATIVE
CASTS: NONE SEEN [LPF]
CRYSTALS: NONE SEEN [HPF]
Glucose, UA: NEGATIVE
HGB URINE DIPSTICK: NEGATIVE
KETONES UR: NEGATIVE
Leukocytes, UA: NEGATIVE
Nitrite: NEGATIVE
PROTEIN: NEGATIVE
RBC / HPF: NONE SEEN RBC/HPF (ref <=2)
Specific Gravity, Urine: 1.026 (ref 1.001–1.035)
WBC, UA: NONE SEEN WBC/HPF (ref <=5)
Yeast: NONE SEEN [HPF]
pH: 6.5 (ref 5.0–8.0)

## 2015-08-01 LAB — PAP, TP IMAGING W/ HPV RNA, RFLX HPV TYPE 16,18/45: HPV MRNA, HIGH RISK: NOT DETECTED

## 2015-08-08 ENCOUNTER — Encounter: Payer: Self-pay | Admitting: Women's Health

## 2015-08-08 ENCOUNTER — Ambulatory Visit (INDEPENDENT_AMBULATORY_CARE_PROVIDER_SITE_OTHER): Payer: 59 | Admitting: Women's Health

## 2015-08-08 ENCOUNTER — Other Ambulatory Visit: Payer: Self-pay | Admitting: Women's Health

## 2015-08-08 ENCOUNTER — Ambulatory Visit (INDEPENDENT_AMBULATORY_CARE_PROVIDER_SITE_OTHER): Payer: 59

## 2015-08-08 VITALS — BP 136/80 | Ht 61.0 in | Wt 122.0 lb

## 2015-08-08 DIAGNOSIS — N852 Hypertrophy of uterus: Secondary | ICD-10-CM

## 2015-08-08 DIAGNOSIS — N839 Noninflammatory disorder of ovary, fallopian tube and broad ligament, unspecified: Secondary | ICD-10-CM

## 2015-08-08 DIAGNOSIS — D251 Intramural leiomyoma of uterus: Secondary | ICD-10-CM

## 2015-08-08 DIAGNOSIS — N83201 Unspecified ovarian cyst, right side: Secondary | ICD-10-CM | POA: Diagnosis not present

## 2015-08-08 DIAGNOSIS — N838 Other noninflammatory disorders of ovary, fallopian tube and broad ligament: Secondary | ICD-10-CM

## 2015-08-08 DIAGNOSIS — Z856 Personal history of leukemia: Secondary | ICD-10-CM | POA: Insufficient documentation

## 2015-08-08 DIAGNOSIS — D259 Leiomyoma of uterus, unspecified: Secondary | ICD-10-CM | POA: Insufficient documentation

## 2015-08-08 NOTE — Progress Notes (Signed)
Patient ID: Margaret Mullen, female   DOB: [birth year], 40 y.o.   MRN: [id number] Presents for ultrasound. At annual exam enlarged uterus noted. Also is having right low to mid abdominal pressure discomfort that radiates to her back. This discomfort started greater than 6 months ago but has increased in the past 2 months. Has used no contraception for 10 years and has not conceived. Had declined fertility intervention. 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  Exam: Appears well. Ultrasound: T/V and T/A anteverted enlarged uterus, endometrium displaced by echogenic intramural fibroid 7 x 5.9 x 8.6 cm, fundal left fibroid 33 x 22 mm. Rim of right ovarian tissue and thin-walled cystic mass 62 x 58 x 55 mm, 58 mm mean, reticular echo pattern, thick 4.8 mm septum avascular, positive arterial CFD to the right ovary. Left ovary normal. Negative cul-de-sac.  Fibroid uterus Right ovarian mass 62 x 50 x 55 mm/questionable endometrioma  Plan: Ultrasound reviewed with Dr. Phineas Real, plan made, CA 125. MRI of abdomen and pelvis. Instructed to schedule follow-up appointment with Dr. Phineas Real to discuss plan/options after MRI.

## 2015-08-08 NOTE — Patient Instructions (Addendum)
Ovarian Cystectomy Ovarian cystectomy is surgery to remove a fluid-filled sac (cyst) on an ovary. The ovaries are small organs that produce eggs in women. Various types of cysts can form on the ovaries. Most are not cancerous. Surgery may be done if a cyst is large or is causing symptoms such as pain. It may also be done for a cyst that is or might be cancerous. This surgery can be done using a laparoscopic technique or an open abdominal technique. The laparoscopic technique involves smaller cuts (incisions) and a faster recovery time. The technique used will depend on your age, the type of cyst, and whether the cyst is cancerous. The laparoscopic technique is not used for a cancerous cyst. LET YOUR HEALTH CARE PROVIDER KNOW ABOUT:   Any allergies you have.  All medicines you are taking, including vitamins, herbs, eye drops, creams, and over-the-counter medicines.  Previous problems you or members of your family have had with the use of anesthetics.  Any blood disorders you have.  Previous surgeries you have had.  Medical conditions you have.  Any chance you might be pregnant. RISKS AND COMPLICATIONS Generally, this is a safe procedure. However, as with any procedure, complications can occur. Possible complications include:  Excessive bleeding.  Infection.  Injury to other organs.  Blood clots.  Becoming incapable of getting pregnant (infertile). BEFORE THE PROCEDURE  Ask your health care provider about changing or stopping any regular medicines. Avoid taking aspirin, ibuprofen, or blood thinners as directed by your health care provider.  Do not eat or drink anything after midnight the night before surgery.  If you smoke, do not smoke for at least 2 weeks before your surgery.  Do not drink alcohol the day before your surgery.  Let your health care provider know if you develop a cold or any infection before your surgery.  Arrange for someone to drive you home after the  procedure or after your hospital stay. Also arrange for someone to help you with activities during recovery. PROCEDURE  Either a laparoscopic technique or an open abdominal technique may be used for this surgery.  Small monitors will be put on your body. They are used to check your heart, blood pressure, and oxygen level.   An IV access tube will be put into one of your veins. Medicine will be able to flow directly into your body through this IV tube.   You might be given a medicine to help you relax (sedative).   You will be given a medicine to make you sleep (general anesthetic). A breathing tube may be placed into your lungs during the procedure. Laparoscopic Technique  Several small cuts (incisions) are made in your abdomen. These are typically about 1 to 2 cm long.   Your abdomen will be filled with carbon dioxide gas so that it expands. This gives the surgeon more room to operate and makes your organs easier to see.   A thin, lighted tube with a tiny camera on the end (laparoscope) is put through one of the small incisions. The camera on the laparoscope sends a picture to a TV screen in the operating room. This gives the surgeon a good view inside your abdomen.   Hollow tubes are put through the other small incisions in your abdomen. The tools needed for the procedure are put through these tubes.  The ovary with the cyst is identified, and the cyst is removed. It is sent to the lab for testing. If it is cancer, both ovaries   may need to be removed during a different surgery.  Tools are removed. The incisions are then closed with stitches or skin glue, and dressings may be applied. Open Abdominal Technique  A single large incision is made along your bikini line or in the middle of your lower abdomen.  The ovary with the cyst is identified, and the cyst is removed. It is sent to the lab for testing. If it is cancer, both ovaries may need to be removed during a different  surgery.  The incision is then closed with stitches or staples. AFTER THE PROCEDURE   You will wake up from anesthesia and be taken to a recovery area.  If you had laparoscopic surgery, you may be able to go home the same day, or you may need to stay in the hospital overnight.  If you had open abdominal surgery, you will need to stay in the hospital for a few days.  Your IV access tube and catheter will be removed the first or second day, after you are able to eat and drink enough.  You may be given medicine to relieve pain or to help you sleep.  You may be given an antibiotic medicine if needed.   This information is not intended to replace advice given to you by your health care provider. Make sure you discuss any questions you have with your health care provider.   Document Released: 01/31/2007 Document Revised: 01/24/2013 Document Reviewed: 11/15/2012 Elsevier Interactive Patient Education 2016 [employer]. Hysterectomy Information  A hysterectomy is a surgery in which your uterus is removed. This surgery may be done to treat various medical problems. After the surgery, you will no longer have menstrual periods. The surgery will also make you unable to become pregnant (sterile). The fallopian tubes and ovaries can be removed (bilateral salpingo-oophorectomy) during this surgery as well.  REASONS FOR A HYSTERECTOMY  Persistent, abnormal bleeding.  Lasting (chronic) pelvic pain or infection.  The lining of the uterus (endometrium) starts growing outside the uterus (endometriosis).  The endometrium starts growing in the muscle of the uterus (adenomyosis).  The uterus falls down into the vagina (pelvic organ prolapse).  Noncancerous growths in the uterus (uterine fibroids) that cause symptoms.  Precancerous cells.  Cervical cancer or uterine cancer. TYPES OF HYSTERECTOMIES  Supracervical hysterectomy--In this type, the top part of the uterus is removed, but not the  cervix.  Total hysterectomy--The uterus and cervix are removed.  Radical hysterectomy--The uterus, the cervix, and the fibrous tissue that holds the uterus in place in the pelvis (parametrium) are removed. WAYS A HYSTERECTOMY CAN BE PERFORMED  Abdominal hysterectomy--A large surgical cut (incision) is made in the abdomen. The uterus is removed through this incision.  Vaginal hysterectomy--An incision is made in the vagina. The uterus is removed through this incision. There are no abdominal incisions.  Conventional laparoscopic hysterectomy--Three or four small incisions are made in the abdomen. A thin, lighted tube with a camera (laparoscope) is inserted into one of the incisions. Other tools are put through the other incisions. The uterus is cut into small pieces. The small pieces are removed through the incisions, or they are removed through the vagina.  Laparoscopically assisted vaginal hysterectomy (LAVH)--Three or four small incisions are made in the abdomen. Part of the surgery is performed laparoscopically and part vaginally. The uterus is removed through the vagina.  Robot-assisted laparoscopic hysterectomy--A laparoscope and other tools are inserted into 3 or 4 small incisions in the abdomen. A computer-controlled device is used  to give the surgeon a 3D image and to help control the surgical instruments. This allows for more precise movements of surgical instruments. The uterus is cut into small pieces and removed through the incisions or removed through the vagina. RISKS AND COMPLICATIONS  Possible complications associated with this procedure include:  Bleeding and risk of blood transfusion. Tell your health care provider if you do not want to receive any blood products.  Blood clots in the legs or lung.  Infection.  Injury to surrounding organs.  Problems or side effects related to anesthesia.  Conversion to an abdominal hysterectomy from one of the other techniques. WHAT TO  EXPECT AFTER A HYSTERECTOMY  You will be given pain medicine.  You will need to have someone with you for the first 3-5 days after you go home.  You will need to follow up with your surgeon in 2-4 weeks after surgery to evaluate your progress.  You may have early menopause symptoms such as hot flashes, night sweats, and insomnia.  If you had a hysterectomy for a problem that was not cancer or not a condition that could lead to cancer, then you no longer need Pap tests. However, even if you no longer need a Pap test, a regular exam is a good idea to make sure no other problems are starting.   This information is not intended to replace advice given to you by your health care provider. Make sure you discuss any questions you have with your health care provider.   Document Released: 09/29/2000 Document Revised: 01/24/2013 Document Reviewed: 12/11/2012 Elsevier Interactive Patient Education 2016 Else[employer] Inc[employer]  Uterine Fibroids Uterine fibroids are tissue masses (tumors) that can develop in the womb (uterus). They are also called leiomyomas. This type of tumor is not cancerous (benign) and does not spread to other parts of the body outside of the pelvic area, which is between the hip bones. Occasionally, fibroids may develop in the fallopian tubes, in the cervix, or on the support structures (ligaments) that surround the uterus. You can have one or many fibroids. Fibroids can vary in size, weight, and where they grow in the uterus. Some can become quite large. Most fibroids do not require medical treatment. CAUSES A fibroid can develop when a single uterine cell keeps growing (replicating). Most cells in the human body have a control mechanism that keeps them from replicating without control. SIGNS AND SYMPTOMS Symptoms may include:   Heavy bleeding during your period.  Bleeding or spotting between periods.  Pelvic pain and pressure.  Bladder problems, such as needing to urinate more often  (urinary frequency) or urgently.  Inability to reproduce offspring (infertility).  Miscarriages. DIAGNOSIS Uterine fibroids are diagnosed through a physical exam. Your health care provider may feel the lumpy tumors during a pelvic exam. Ultrasonography and an MRI may be done to determine the size, location, and number of fibroids. TREATMENT Treatment may include:  Watchful waiting. This involves getting the fibroid checked by your health care provider to see if it grows or shrinks. Follow your health care provider's recommendations for how often to have this checked.  Hormone medicines. These can be taken by mouth or given through an intrauterine device (IUD).  Surgery.  Removing the fibroids (myomectomy) or the uterus (hysterectomy).  Removing blood supply to the fibroids (uterine artery embolization). If fibroids interfere with your fertility and you want to become pregnant, your health care provider may recommend having the fibroids removed.  HOME CARE INSTRUCTIONS  Keep all follow-up  visits as directed by your health care provider. This is important.  Take medicines only as directed by your health care provider.  If you were prescribed a hormone treatment, take the hormone medicines exactly as directed.  Do not take aspirin, because it can cause bleeding.  Ask your health care provider about taking iron pills and increasing the amount of dark green, leafy vegetables in your diet. These actions can help to boost your blood iron levels, which may be affected by heavy menstrual bleeding.  Pay close attention to your period and tell your health care provider about any changes, such as:  Increased blood flow that requires you to use more pads or tampons than usual per month.  A change in the number of days that your period lasts per month.  A change in symptoms that are associated with your period, such as abdominal cramping or back pain. SEEK MEDICAL CARE IF:  You have pelvic  pain, back pain, or abdominal cramps that cannot be controlled with medicines.  You have an increase in bleeding between and during periods.  You soak tampons or pads in a half hour or less.  You feel lightheaded, extra tired, or weak. SEEK IMMEDIATE MEDICAL CARE IF:  You faint.  You have a sudden increase in pelvic pain.   This information is not intended to replace advice given to you by your health care provider. Make sure you discuss any questions you have with your health care provider.   Document Released: 04/02/2000 Document Revised: 04/26/2014 Document Reviewed: 10/02/2013 Elsevier Interactive Patient Education [employer].

## 2015-08-11 LAB — CA 125: CA 125: 43 U/mL — AB (ref <35)

## 2015-08-15 ENCOUNTER — Telehealth: Payer: Self-pay | Admitting: *Deleted

## 2015-08-15 ENCOUNTER — Encounter: Payer: Self-pay | Admitting: Women's Health

## 2015-08-15 DIAGNOSIS — N838 Other noninflammatory disorders of ovary, fallopian tube and broad ligament: Secondary | ICD-10-CM

## 2015-08-15 DIAGNOSIS — R1084 Generalized abdominal pain: Secondary | ICD-10-CM

## 2015-08-15 NOTE — Telephone Encounter (Signed)
-----   Message from Huel Cote, NP sent at 08/08/2015  4:05 PM EDT ----- Pt. needs a MRI of the pelvis/abdomen. She has a right ovarian cystic mass 62 x 58 x 55 mm 58 mm mean reticular echo pattern with thick 4.8 mm septum avascular. Also has 2 fibroids - 7 x 6 x 8 cm  and a  32 x 22 mm. Ca 125 pending.  Will need appt with Dr Phineas Real after the MRI to discuss.  Fridays are best for pt,

## 2015-08-15 NOTE — Telephone Encounter (Signed)
Appt. 08/22/15 @ 2:45pm at [facility] MRI pt will need to be NPO 4 hours prior to exam, left message for pt to call

## 2015-08-15 NOTE — Telephone Encounter (Signed)
Pt informed with the below. 

## 2015-08-18 NOTE — Telephone Encounter (Signed)
Pt called today and canceled MRI.

## 2015-08-22 ENCOUNTER — Ambulatory Visit (HOSPITAL_COMMUNITY): Payer: 59

## 2015-09-24 ENCOUNTER — Encounter: Payer: Self-pay | Admitting: Women's Health

## 2015-10-01 ENCOUNTER — Telehealth: Payer: Self-pay | Admitting: Women's Health

## 2015-10-01 ENCOUNTER — Other Ambulatory Visit: Payer: Self-pay | Admitting: Women's Health

## 2015-10-01 DIAGNOSIS — D259 Leiomyoma of uterus, unspecified: Secondary | ICD-10-CM

## 2015-10-01 DIAGNOSIS — N83201 Unspecified ovarian cyst, right side: Secondary | ICD-10-CM

## 2015-10-01 NOTE — Telephone Encounter (Signed)
Patient needs office visit with physician to discuss the situation and the risks of expectant management i.e. cancer unaddressed. Does not mean she has to have surgery but she clearly needs to understand the risks of not having surgery and I would prefer to do this face-to-face.

## 2015-10-01 NOTE — Telephone Encounter (Signed)
Telephone call, states unable to schedule hysterectomy at this time due to high cost of deductible but would like to do in the future. History of 7 x 8 cm fibroid and 6 cm ovarian cyst CA 125 was 43. Will repeat ultrasound in July or August to check stability of cyst and repeat CA 125. Agreeable with plan.

## 2015-10-01 NOTE — Telephone Encounter (Signed)
Telephone call, history of 7 x 8 cm fibroid uterus and 6 cm cyst was planning  a hysterectomy reports cannot schedule at this time due to high cost/deductible but would like to do in the future. CA 125 was 43. We'll recheck a ultrasound in July or August to check for stability of the 6 cm cyst and repeat Ca-125. Agreeable with plan. Will call to schedule ultrasound.

## 2016-01-01 ENCOUNTER — Encounter: Payer: Self-pay | Admitting: Podiatry

## 2016-01-01 ENCOUNTER — Ambulatory Visit (INDEPENDENT_AMBULATORY_CARE_PROVIDER_SITE_OTHER): Payer: 59 | Admitting: Podiatry

## 2016-01-01 VITALS — BP 131/81 | HR 78 | Resp 12

## 2016-01-01 DIAGNOSIS — B351 Tinea unguium: Secondary | ICD-10-CM | POA: Diagnosis not present

## 2016-01-01 DIAGNOSIS — M79676 Pain in unspecified toe(s): Secondary | ICD-10-CM

## 2016-01-01 NOTE — Progress Notes (Signed)
   Subjective:    Patient ID: Margaret Mullen, female    DOB: Feb 08, 1976, 40 y.o.   MRN: [id number]  HPI this patient presents the office with chief complaint of disfigured discolored toenails on the first and second toes of the left foot. She gives a history of injuring her left big toe and having the nail removed when she was 18. Since then it has grown back deformed and discolored. She says it was painful last week, which prompted her to trim the nail back to the point of attachment. She also has thick disfigured discolored, second toenail left foot which is not painful. She presents the office today for an evaluation and treatment of these nails    Review of Systems  All other systems reviewed and are negative.      Objective:   Physical Exam GENERAL APPEARANCE: Alert, conversant. Appropriately groomed. No acute distress.  VASCULAR: Pedal pulses are  palpable at  [facility] and PT bilateral.  Capillary refill time is immediate to all digits,  Normal temperature gradient.  Digital hair growth is present bilateral  NEUROLOGIC: sensation is normal to 5.07 monofilament at 5/5 sites bilateral.  Light touch is intact bilateral, Muscle strength normal.  MUSCULOSKELETAL: acceptable muscle strength, tone and stability bilateral.  Intrinsic muscluature intact bilateral.  Rectus appearance of foot and digits noted bilateral.   DERMATOLOGIC: skin color, texture, and turgor are within normal limits.  No preulcerative lesions or ulcers  are seen, no interdigital maceration noted.  No open lesions present.  . No drainage noted.  NAILS  Thick disfigured discolored nails 1,2 left foot.         Assessment & Plan:  Onychomycosis  1,2 left foot.   IE  Examination of her toes do reveal a thick disfigured nail on both the first and second toes, left foot. She says she worked on trimming the nails herself last week and there is no nail to be trimmed. I told her she should return to the office in 2-3 weeks when the  nails have regrown and the nail sample be taken at that time, we will then be sent to [facility]. She is to call the office in 2-3 weeks when the nail has grown long enough.   Gardiner Barefoot DPM

## 2016-02-12 ENCOUNTER — Ambulatory Visit: Payer: 59 | Admitting: Podiatry

## 2016-02-24 ENCOUNTER — Encounter: Payer: Self-pay | Admitting: Podiatry

## 2016-03-31 ENCOUNTER — Other Ambulatory Visit: Payer: Self-pay | Admitting: *Deleted

## 2016-03-31 ENCOUNTER — Ambulatory Visit (INDEPENDENT_AMBULATORY_CARE_PROVIDER_SITE_OTHER): Payer: 59 | Admitting: Podiatry

## 2016-03-31 DIAGNOSIS — B351 Tinea unguium: Secondary | ICD-10-CM

## 2016-03-31 DIAGNOSIS — M79676 Pain in unspecified toe(s): Secondary | ICD-10-CM

## 2016-04-01 NOTE — Progress Notes (Signed)
   Subjective:    Patient ID: Margaret Mullen, female    DOB: [birth year], 40 y.o.   MRN: [id number]  HPI this patient presents the office with chief complaint of disfigured discolored toenails on the first and second toes of the left foot.  She says she presents to the office for explanation of lab results.    Review of Systems  All other systems reviewed and are negative.      Objective:   Physical Exam GENERAL APPEARANCE: Alert, conversant. Appropriately groomed. No acute distress.  VASCULAR: Pedal pulses are  palpable at  [facility] and PT bilateral.  Capillary refill time is immediate to all digits,  Normal temperature gradient.  Digital hair growth is present bilateral  NEUROLOGIC: sensation is normal to 5.07 monofilament at 5/5 sites bilateral.  Light touch is intact bilateral, Muscle strength normal.  MUSCULOSKELETAL: acceptable muscle strength, tone and stability bilateral.  Intrinsic muscluature intact bilateral.  Rectus appearance of foot and digits noted bilateral.   DERMATOLOGIC: skin color, texture, and turgor are within normal limits.  No preulcerative lesions or ulcers  are seen, no interdigital maceration noted.  No open lesions present.  . No drainage noted.  NAILS  Thick disfigured discolored nails 1,2 left foot.         Assessment & Plan:  Onychomycosis  1,2 left foot.   ROV  The lab results reveal positive for fungus.  Therefore Bloodwork was ordered prior to taking lamisil by mouth.  Once the results arrive and are normal lamisil will be called into her pharmacy.   Gardiner Barefoot DPM

## 2016-04-22 ENCOUNTER — Other Ambulatory Visit: Payer: Self-pay | Admitting: Podiatry

## 2016-04-22 LAB — HEPATIC FUNCTION PANEL
ALT: 31 U/L — AB (ref 6–29)
AST: 24 U/L (ref 10–30)
Albumin: 4.1 g/dL (ref 3.6–5.1)
Alkaline Phosphatase: 46 U/L (ref 33–115)
BILIRUBIN DIRECT: 0.1 mg/dL (ref <=0.2)
BILIRUBIN INDIRECT: 0.1 mg/dL — AB (ref 0.2–1.2)
BILIRUBIN TOTAL: 0.2 mg/dL (ref 0.2–1.2)
Total Protein: 6.7 g/dL (ref 6.1–8.1)

## 2016-04-25 ENCOUNTER — Encounter: Payer: Self-pay | Admitting: Women's Health

## 2016-05-13 ENCOUNTER — Encounter: Payer: Self-pay | Admitting: Women's Health

## 2016-05-13 ENCOUNTER — Ambulatory Visit (INDEPENDENT_AMBULATORY_CARE_PROVIDER_SITE_OTHER): Payer: 59 | Admitting: Women's Health

## 2016-05-13 ENCOUNTER — Other Ambulatory Visit: Payer: Self-pay | Admitting: Women's Health

## 2016-05-13 ENCOUNTER — Ambulatory Visit (INDEPENDENT_AMBULATORY_CARE_PROVIDER_SITE_OTHER): Payer: 59

## 2016-05-13 DIAGNOSIS — D251 Intramural leiomyoma of uterus: Secondary | ICD-10-CM

## 2016-05-13 DIAGNOSIS — N83201 Unspecified ovarian cyst, right side: Secondary | ICD-10-CM | POA: Diagnosis not present

## 2016-05-13 DIAGNOSIS — R9389 Abnormal findings on diagnostic imaging of other specified body structures: Secondary | ICD-10-CM

## 2016-05-13 DIAGNOSIS — N831 Corpus luteum cyst of ovary, unspecified side: Secondary | ICD-10-CM | POA: Diagnosis not present

## 2016-05-13 DIAGNOSIS — D259 Leiomyoma of uterus, unspecified: Secondary | ICD-10-CM | POA: Diagnosis not present

## 2016-05-13 DIAGNOSIS — N852 Hypertrophy of uterus: Secondary | ICD-10-CM

## 2016-05-13 DIAGNOSIS — R938 Abnormal findings on diagnostic imaging of other specified body structures: Secondary | ICD-10-CM

## 2016-05-13 NOTE — Progress Notes (Addendum)
Presents for follow-up ultrasound. History of an 8 cm fibroid. Was contemplating hysterectomy but has now decided she would like to pursue pregnancy. She has never used any contraception for the past 20 years and has never had a pregnancy. Husband has a daughter from a previous relationship. Has frequent intercourse, regular monthly cycle until this past year has had 2  skipped cycles. Had previously said she desired no intervention for infertility/conception.   Exam: Appears well. Ultrasound: T/V and T/A anteverted enlarged uterus, endometrium prominently displaced by fibroid 8.4 x 8 x 8.2 cm increased in size. Right ovary normal. Previous cyst not seen. Left ovary corpus luteal thick-walled involuting cyst 24 x 18 mm. Negative cul-de-sac. Second fibroid previous scan not seen.  Large intramural fibroid desiring pregnancy  Plan: Referral for surgical/infertility management. Prenatal vitamin daily encouraged. Aware of safe pregnancy behaviors. Aware of decreased fertility after age 16.

## 2016-05-13 NOTE — Patient Instructions (Signed)
  Uterine Fibroids Uterine fibroids are tissue masses (tumors). They are also called leiomyomas. They can develop inside of a woman's womb (uterus). They can grow very large. Fibroids are not cancerous (benign). Most fibroids do not require medical treatment. Follow these instructions at home:  Keep all follow-up visits as told by your doctor. This is important.  Take medicines only as told by your doctor.  If you were prescribed a hormone treatment, take the hormone medicines exactly as told.  Do not take aspirin. It can cause bleeding.  Ask your doctor about taking iron pills and increasing the amount of dark green, leafy vegetables in your diet. These actions can help to boost your blood iron levels.  Pay close attention to your period. Tell your doctor about any changes, such as:  Increased blood flow. This may require you to use more pads or tampons than usual per month.  A change in the number of days that your period lasts per month.  A change in symptoms that come with your period, such as back pain or cramping in your belly area (abdomen). Contact a doctor if:  You have pain in your back or the area between your hip bones (pelvic area) that is not controlled by medicines.  You have pain in your abdomen that is not controlled with medicines.  You have an increase in bleeding between and during periods.  You soak tampons or pads in a half hour or less.  You feel lightheaded.  You feel extra tired.  You feel weak. Get help right away if:  You pass out (faint).  You have a sudden increase in pelvic pain. This information is not intended to replace advice given to you by your health care provider. Make sure you discuss any questions you have with your health care provider. Document Released: 05/08/2010 Document Revised: 12/05/2015 Document Reviewed: 10/02/2013 Elsevier Interactive Patient Education  2017 [employer].

## 2016-05-14 ENCOUNTER — Encounter: Payer: Self-pay | Admitting: Women's Health

## 2016-05-14 ENCOUNTER — Telehealth: Payer: Self-pay | Admitting: *Deleted

## 2016-05-14 NOTE — Telephone Encounter (Signed)
-----   Message from Huel Cote, NP sent at 05/13/2016  5:43 PM EST ----- Anderson Malta please schedule her an appointment with Dr. Kerin Perna to evaluate 8 cm intramural fibroid and infertility. She is off on Fridays which would be best if available. Please see office note 05/13/2016.

## 2016-05-14 NOTE — Telephone Encounter (Signed)
Referral faxed to Dr.Yalcinkaya office they will contact pt to schedule. 

## 2016-05-25 NOTE — Telephone Encounter (Signed)
I re-faxed office notes to office again.

## 2016-06-01 NOTE — Telephone Encounter (Signed)
Pt schedule don 08/06/16 @ 2:00pm

## 2016-06-09 ENCOUNTER — Other Ambulatory Visit: Payer: Self-pay | Admitting: Physician Assistant

## 2016-06-09 DIAGNOSIS — Z1231 Encounter for screening mammogram for malignant neoplasm of breast: Secondary | ICD-10-CM

## 2016-08-05 ENCOUNTER — Encounter: Payer: 59 | Admitting: Women's Health

## 2016-08-20 ENCOUNTER — Ambulatory Visit
Admission: RE | Admit: 2016-08-20 | Discharge: 2016-08-20 | Disposition: A | Discharge status: Home / Self Care | Payer: 59 | Source: Ambulatory Visit | Attending: Physician Assistant | Admitting: Physician Assistant

## 2016-08-20 DIAGNOSIS — Z1231 Encounter for screening mammogram for malignant neoplasm of breast: Secondary | ICD-10-CM

## 2016-09-01 ENCOUNTER — Encounter: Payer: Self-pay | Admitting: Gynecology

## 2016-10-08 ENCOUNTER — Ambulatory Visit: Payer: 59 | Admitting: Podiatry

## 2016-11-12 ENCOUNTER — Encounter: Payer: Self-pay | Admitting: Podiatry

## 2016-11-12 ENCOUNTER — Ambulatory Visit (INDEPENDENT_AMBULATORY_CARE_PROVIDER_SITE_OTHER): Payer: 59 | Admitting: Podiatry

## 2016-11-12 DIAGNOSIS — M79676 Pain in unspecified toe(s): Secondary | ICD-10-CM

## 2016-11-12 DIAGNOSIS — L600 Ingrowing nail: Secondary | ICD-10-CM

## 2016-11-12 DIAGNOSIS — B351 Tinea unguium: Secondary | ICD-10-CM | POA: Diagnosis not present

## 2016-11-12 NOTE — Progress Notes (Signed)
   Subjective:    Patient ID: Margaret Mullen, female    DOB: [birth year], 41 y.o.   MRN: [id number]  HPI this patient presents the office with chief complaint of disfigured discolored toenails on the first and second toes of the left foot. She gives a history of injuring her left big toe and having the nail removed when she was 18. Since then it has grown back deformed and discolored.  She has been treating this condition since she was 18 and has done topicals as well as Lamisil by mouth.  No improvement to the nails has ever occurred.  She presents the office today desiring permanent removal of the first and second toenails left foot. Due to the pain that she experiences from the fungus toenails    Review of Systems  All other systems reviewed and are negative.      Objective:   Physical Exam GENERAL APPEARANCE: Alert, conversant. Appropriately groomed. No acute distress.  VASCULAR: Pedal pulses are  palpable at  [facility] and PT bilateral.  Capillary refill time is immediate to all digits,  Normal temperature gradient.  Digital hair growth is present bilateral  NEUROLOGIC: sensation is normal to 5.07 monofilament at 5/5 sites bilateral.  Light touch is intact bilateral, Muscle strength normal.  MUSCULOSKELETAL: acceptable muscle strength, tone and stability bilateral.  Intrinsic muscluature intact bilateral.  Rectus appearance of foot and digits noted bilateral.   DERMATOLOGIC: skin color, texture, and turgor are within normal limits.  No preulcerative lesions or ulcers  are seen, no interdigital maceration noted.  No open lesions present.  . No drainage noted.  NAILS  Thick disfigured discolored nails 1,2 left foot.         Assessment & Plan:  Onychomycosis  1,2 left foot.  Return office visit.  Nail surgery first and second digits, left foot.  Treatment options and alternatives discussed.  Recommended permanent phenol matrixectomy and patient agreed.  First and second digits  Were  prepped  with alcohol and a toe block of 3cc of 2% lidocaine plain was administered in a digital toe block. .  The toe was then prepped with betadine solution .  The offending nail border was then excised and matrix tissue exposed.  Phenol was then applied to the matrix tissue followed by an alcohol wash.  Antibiotic ointment and a dry sterile dressing was applied.  The patient was dispensed instructions for aftercare. RTC 1 week.       Gardiner Barefoot DPM    Gardiner Barefoot DPM

## 2016-11-19 ENCOUNTER — Encounter: Payer: Self-pay | Admitting: Podiatry

## 2016-11-19 ENCOUNTER — Ambulatory Visit (INDEPENDENT_AMBULATORY_CARE_PROVIDER_SITE_OTHER): Payer: Self-pay | Admitting: Podiatry

## 2016-11-19 DIAGNOSIS — Z09 Encounter for follow-up examination after completed treatment for conditions other than malignant neoplasm: Secondary | ICD-10-CM

## 2016-11-19 NOTE — Progress Notes (Signed)
This patient returns to the office following nail surgery one week ago.  The patient says toe has been soaked and bandaged as directed.  There has been improvement of the toe since the surgery has been performed. The patient presents for continued evaluation and treatment.  GENERAL APPEARANCE: Alert, conversant. Appropriately groomed. No acute distress.  VASCULAR: Pedal pulses palpable at  DP and PT bilateral.  Capillary refill time is immediate to all digits,  Normal temperature gradient.    NEUROLOGIC: sensation is normal to 5.07 monofilament at 5/5 sites bilateral.  Light touch is intact bilateral, Muscle strength normal.  MUSCULOSKELETAL: acceptable muscle strength, tone and stability bilateral.  Intrinsic muscluature intact bilateral.  Rectus appearance of foot and digits noted bilateral.   DERMATOLOGIC: skin color, texture, and turgor are within normal limits.  No preulcerative lesions or ulcers  are seen, no interdigital maceration noted.   NAILS  There is necrotic tissue along the nail groove  In the absence of redness swelling and pain.  DX  S/p nail surgery  ROV  Home instructions were discussed.  Patient to call the office if there are any questions or concerns.   Rhanda Lemire DPM   

## 2017-02-08 ENCOUNTER — Ambulatory Visit: Payer: 59 | Admitting: Women's Health

## 2017-02-23 ENCOUNTER — Other Ambulatory Visit (HOSPITAL_COMMUNITY): Payer: Self-pay | Admitting: Specialist

## 2017-02-23 DIAGNOSIS — D259 Leiomyoma of uterus, unspecified: Secondary | ICD-10-CM

## 2017-03-01 ENCOUNTER — Ambulatory Visit (HOSPITAL_COMMUNITY)
Admission: RE | Admit: 2017-03-01 | Discharge: 2017-03-01 | Disposition: A | Discharge status: Home / Self Care | Payer: 59 | Source: Ambulatory Visit | Attending: Specialist | Admitting: Specialist

## 2017-03-01 DIAGNOSIS — N838 Other noninflammatory disorders of ovary, fallopian tube and broad ligament: Secondary | ICD-10-CM | POA: Insufficient documentation

## 2017-03-01 DIAGNOSIS — D259 Leiomyoma of uterus, unspecified: Secondary | ICD-10-CM | POA: Diagnosis present

## 2017-03-01 LAB — POCT I-STAT CREATININE: CREATININE: 0.9 mg/dL (ref 0.44–1.00)

## 2017-03-01 MED ORDER — GADOBENATE DIMEGLUMINE 529 MG/ML IV SOLN
10.0000 mL | Freq: Once | INTRAVENOUS | Status: AC | PRN
  Administered 2017-03-01: 10 mL via INTRAVENOUS

## 2017-03-03 ENCOUNTER — Ambulatory Visit (HOSPITAL_COMMUNITY): Payer: 59

## 2017-03-16 ENCOUNTER — Encounter: Payer: 59 | Admitting: Women's Health

## 2017-09-17 HISTORY — PX: ROBOTIC ASSISTED LAPAROSCOPIC HYSTERECTOMY AND SALPINGECTOMY: SHX6379

## 2018-01-20 ENCOUNTER — Encounter: Payer: Self-pay | Admitting: Podiatry

## 2018-01-20 ENCOUNTER — Ambulatory Visit (INDEPENDENT_AMBULATORY_CARE_PROVIDER_SITE_OTHER): Payer: 59 | Admitting: Podiatry

## 2018-01-20 DIAGNOSIS — M79609 Pain in unspecified limb: Secondary | ICD-10-CM

## 2018-01-20 DIAGNOSIS — L603 Nail dystrophy: Secondary | ICD-10-CM

## 2018-01-20 DIAGNOSIS — B351 Tinea unguium: Secondary | ICD-10-CM

## 2018-01-20 DIAGNOSIS — L600 Ingrowing nail: Secondary | ICD-10-CM

## 2018-01-20 MED ORDER — NEOMYCIN-POLYMYXIN-HC 3.5-10000-1 OT SOLN: OTIC | 0 refills | Status: DC

## 2018-01-20 NOTE — Progress Notes (Signed)
  Subjective:  Patient ID: Margaret Mullen, female    DOB: November 19, 1975,  MRN: [id number]  Chief Complaint  Patient presents with  . Nail Problem    onychomycosis of great toe left - patient wants nail off - also 2nd left toe the medial corner catches on everything    42 y.o. female presents with the above complaint. Reports pain and thickening to the left great toenail. Would like to have the nail removed because it is so painful. Had the left 2nd toenail removed but has a small area where the nail regrew and is painful.  Review of Systems: Negative except as noted in the HPI. Denies N/V/F/Ch.  No past medical history on file.  Current Outpatient Medications:  .  neomycin-polymyxin-hydrocortisone (CORTISPORIN) OTIC solution, Apply 2 drops to the ingrown toenail site twice daily. Cover with band-aid., Disp: 10 mL, Rfl: 0  Social History   Tobacco Use  Smoking Status Never Smoker  Smokeless Tobacco Never Used    No Known Allergies Objective:  There were no vitals filed for this visit. There is no height or weight on file to calculate BMI. Constitutional Well developed. Well nourished.  Vascular Dorsalis pedis pulses palpable bilaterally. Posterior tibial pulses palpable bilaterally. Capillary refill normal to all digits.  No cyanosis or clubbing noted. Pedal hair growth normal.  Neurologic Normal speech. Oriented to person, place, and time. Epicritic sensation to light touch grossly present bilaterally.  Dermatologic Painful dystrophic nail L hallux, L 2nd toe residual spicule laterl 2nd border. No other open wounds. No skin lesions.  Orthopedic: Normal joint ROM without pain or crepitus bilaterally. No visible deformities. No bony tenderness.   Radiographs: None Assessment:   1. Dystrophic nail   2. Pain due to onychomycosis of nail   3. Ingrown nail    Plan:  Patient was evaluated and treated and all questions answered.  Painful dystrophic -Patient elects to  proceed with minor surgery to remove the toenail removal today. Consent reviewed and signed by patient. -Ingrown nail excised. See procedure note. -Educated on post-procedure care including soaking. Written instructions provided and reviewed. -Patient to follow up in 2 weeks for nail check.  Procedure: Excision of Ingrown Toenail Location: Left 1st toe and 2nd toe both nail borders. Anesthesia: Lidocaine 1% plain; 1.5 mL and Marcaine 0.5% plain; 1.5 mL, digital block. Skin Prep: Betadine. Dressing: Silvadene; telfa; dry, sterile, compression dressing. Technique: Following skin prep, the toe was exsanguinated and a tourniquet was secured at the base of the toe. The affected nail border was freed, split with a nail splitter, and excised. Chemical matrixectomy was then performed with phenol and irrigated out with alcohol. The tourniquet was then removed and sterile dressing applied. Disposition: Patient tolerated procedure well. Patient to return in 2 weeks for follow-up.   Return in about 2 weeks (around 02/03/2018) for Nail Check.

## 2018-01-20 NOTE — Patient Instructions (Signed)

## 2018-02-10 ENCOUNTER — Encounter: Payer: Self-pay | Admitting: Podiatry

## 2018-02-10 ENCOUNTER — Ambulatory Visit (INDEPENDENT_AMBULATORY_CARE_PROVIDER_SITE_OTHER): Payer: 59 | Admitting: Podiatry

## 2018-02-10 DIAGNOSIS — L603 Nail dystrophy: Secondary | ICD-10-CM

## 2018-02-13 NOTE — Progress Notes (Signed)
  Subjective:  Patient ID: Margaret Mullen, female    DOB: 05-05-1975,  MRN: [id number]  Chief Complaint  Patient presents with  . Nail Check    left foot great toe follow up; pt stated, "no pain, doing okay"   42 y.o. female returns for the above complaint.   Objective:   General AA&O x3. Normal mood and affect.  Vascular Foot warm and well perfused with good capillary refill.  Neurologic Sensation grossly intact.  Dermatologic Nail avulsion site healing well without drainage or erythema. Nail bed with overlying soft crust. Left intact. No signs of local infection.  Orthopedic: No tenderness to palpation of the toe.   Assessment & Plan:  Patient was evaluated and treated and all questions answered.  S/p Ingrown Toenail Excision, left -Healing well without issue. -Discussed return precautions. -F/u PRN

## 2018-03-06 ENCOUNTER — Encounter: Payer: Self-pay | Admitting: Women's Health

## 2018-03-06 ENCOUNTER — Ambulatory Visit (INDEPENDENT_AMBULATORY_CARE_PROVIDER_SITE_OTHER): Payer: 59 | Admitting: Women's Health

## 2018-03-06 VITALS — BP 124/70 | Wt 113.4 lb

## 2018-03-06 DIAGNOSIS — N898 Other specified noninflammatory disorders of vagina: Secondary | ICD-10-CM

## 2018-03-06 LAB — WET PREP FOR TRICH, YEAST, CLUE

## 2018-03-06 MED ORDER — FLUCONAZOLE 150 MG PO TABS: 150.0000 mg | ORAL_TABLET | Freq: Once | ORAL | 0 refills | Status: DC

## 2018-03-06 MED ORDER — FLUCONAZOLE 150 MG PO TABS: 150.0000 mg | ORAL_TABLET | Freq: Once | ORAL | 0 refills | Status: AC

## 2018-03-06 MED ORDER — ESTRADIOL 10 MCG VA TABS: ORAL_TABLET | VAGINAL | 11 refills | Status: DC

## 2018-03-06 NOTE — Progress Notes (Signed)
42 year old M HF G0 presents with complaint of vaginal itching for the past few weeks has used over-the-counter Monistat, Azo with minimal relief.  Denies visible discharge, urinary symptoms, abdominal pain or fever.  Has been treated for infertility in [place], 09/2017 had a robotic hysterectomy with RSO for fibroids , had a right urethral injury.  Doing better. Also reports dyspareunia since hysterectomy.  Reports normal mammogram 12/2017 in [place].  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  Exam: Appears well.  External genitalia mild erythema, speculum exam scant discharge without erythema or odor noted, wet prep negative.  Was extremely tender with exam vaginally.    Vaginal itching Dyspareunia  Plan: Diflucan 150 p.o. x1 dose, reviewed itching may be also from dryness.  Options reviewed, Vagifem per vagina nightly for 2 weeks and then twice weekly thereafter.  Prescription, proper use given.  Reviewed minimal systemic absorption, will call if no relief of itching and dyspareunia.

## 2018-03-08 ENCOUNTER — Other Ambulatory Visit: Payer: Self-pay | Admitting: Women's Health

## 2018-03-08 ENCOUNTER — Other Ambulatory Visit: Payer: Self-pay

## 2018-03-08 MED ORDER — ESTRADIOL 10 MCG VA TABS: ORAL_TABLET | VAGINAL | 11 refills | Status: DC

## 2019-02-16 IMAGING — MG 2D DIGITAL SCREENING BILATERAL MAMMOGRAM WITH CAD AND ADJUNCT TO
9 of 12 series · 9 of 28 positions shown · non-contrast
Comparison: None.

CLINICAL DATA: Screening.

EXAM:
2D DIGITAL SCREENING BILATERAL MAMMOGRAM WITH CAD AND ADJUNCT TOMO

[R CC synth-2D]
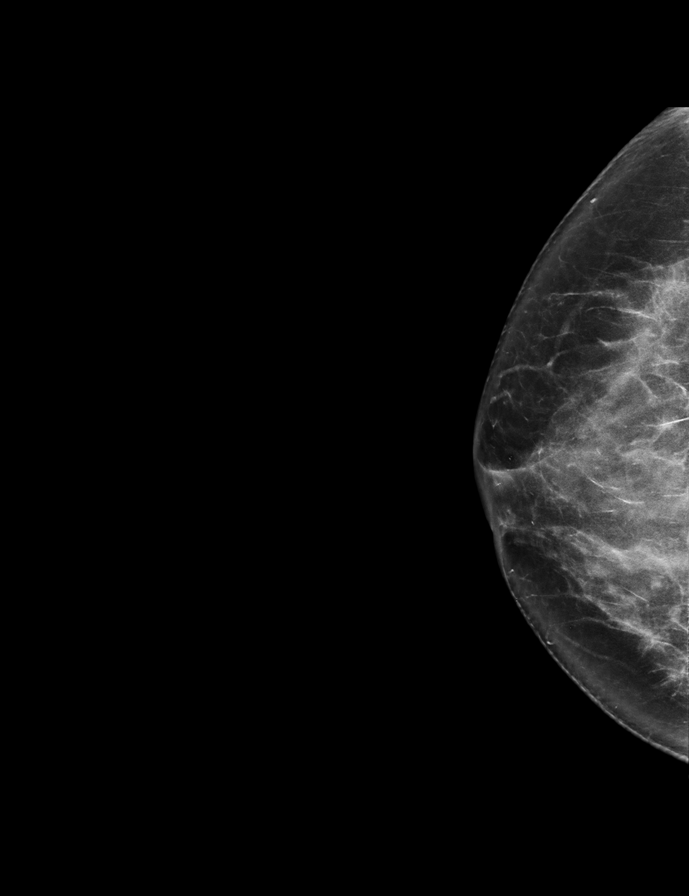

[R MLO synth-2D]
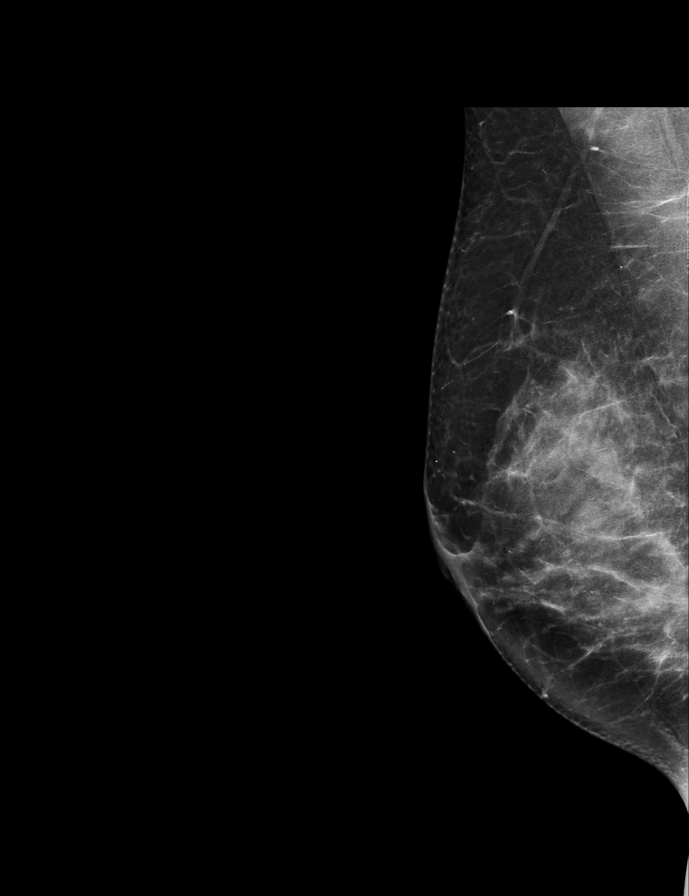

[L CC]
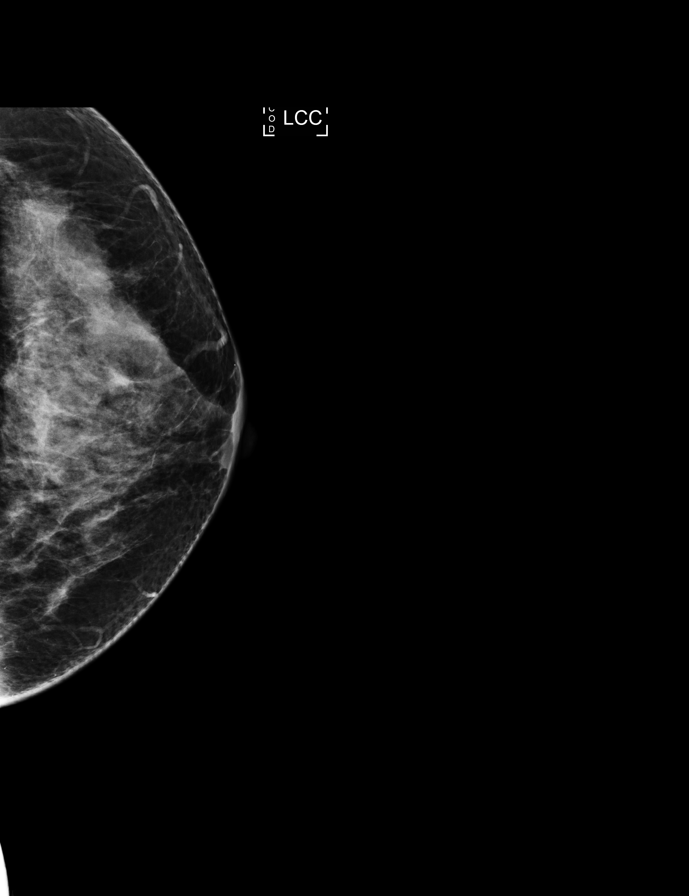

[R MLO]
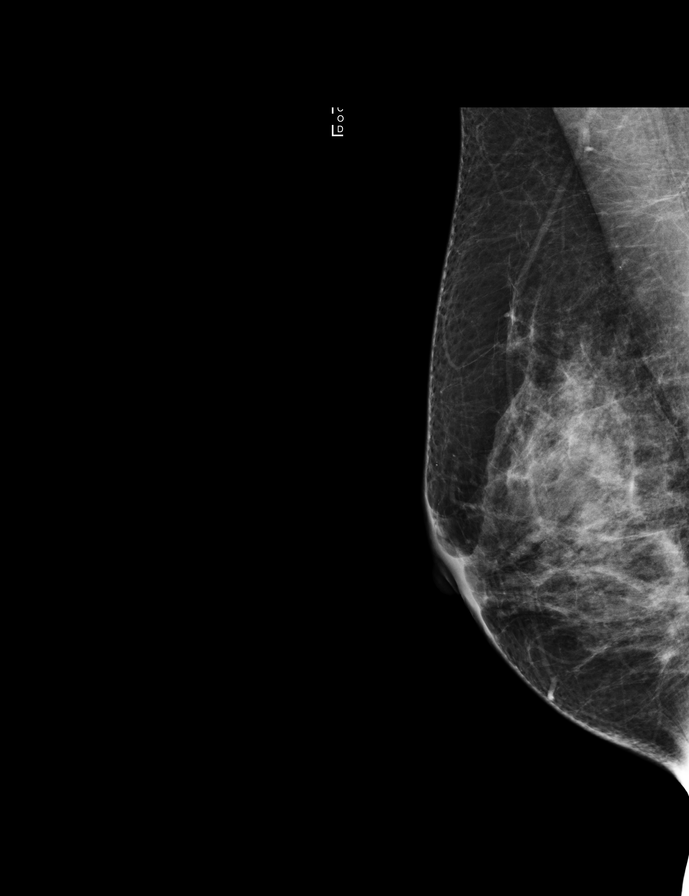

[L CC synth-2D]
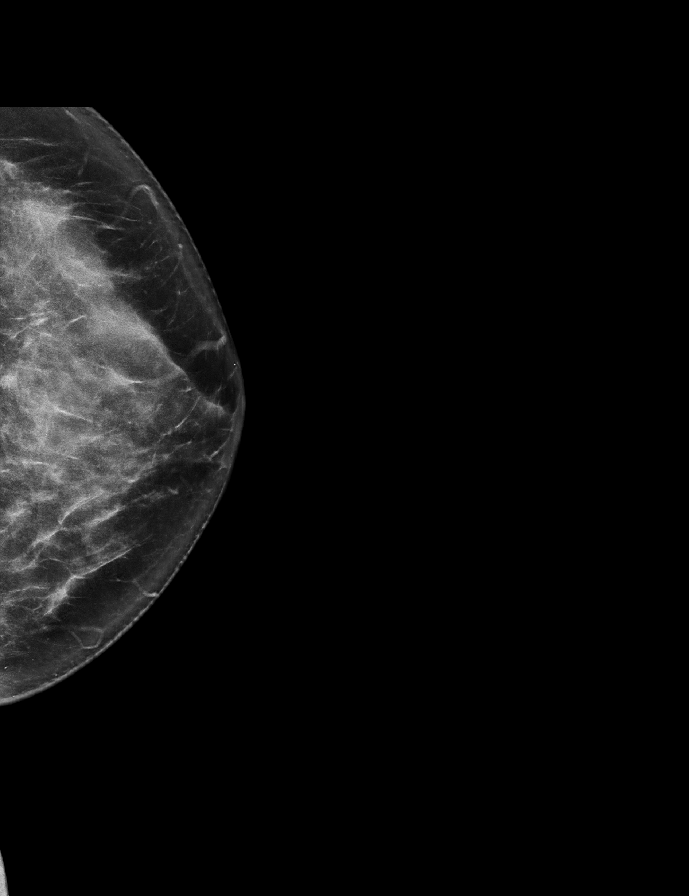

[R CC]
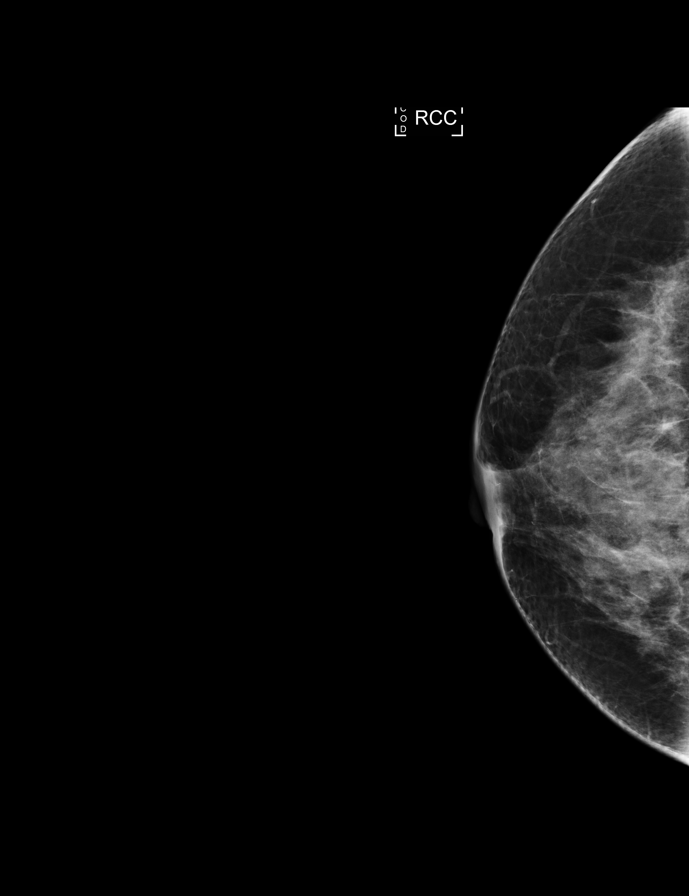

[L MLO]
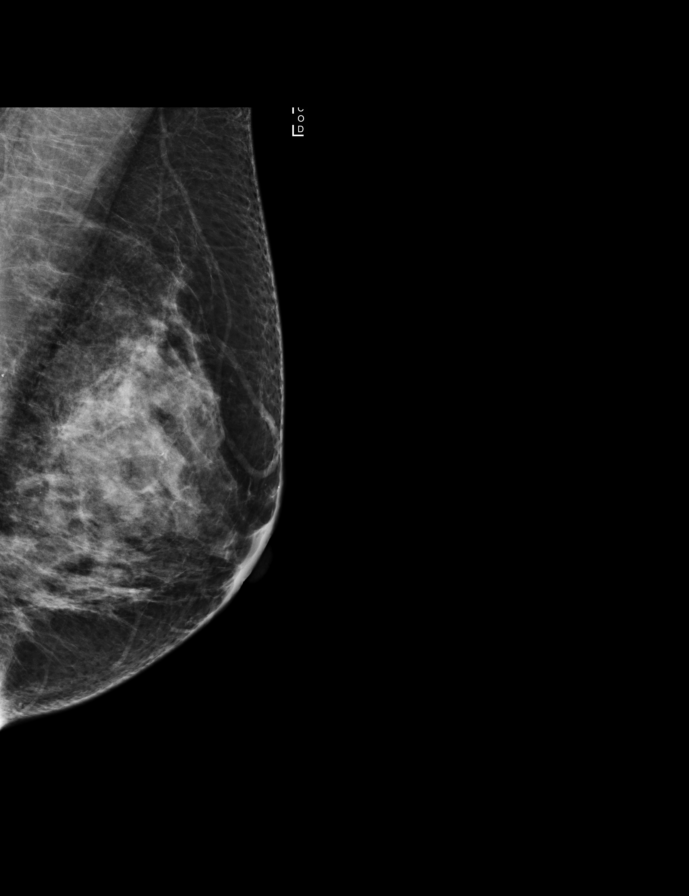

[L MLO synth-2D]
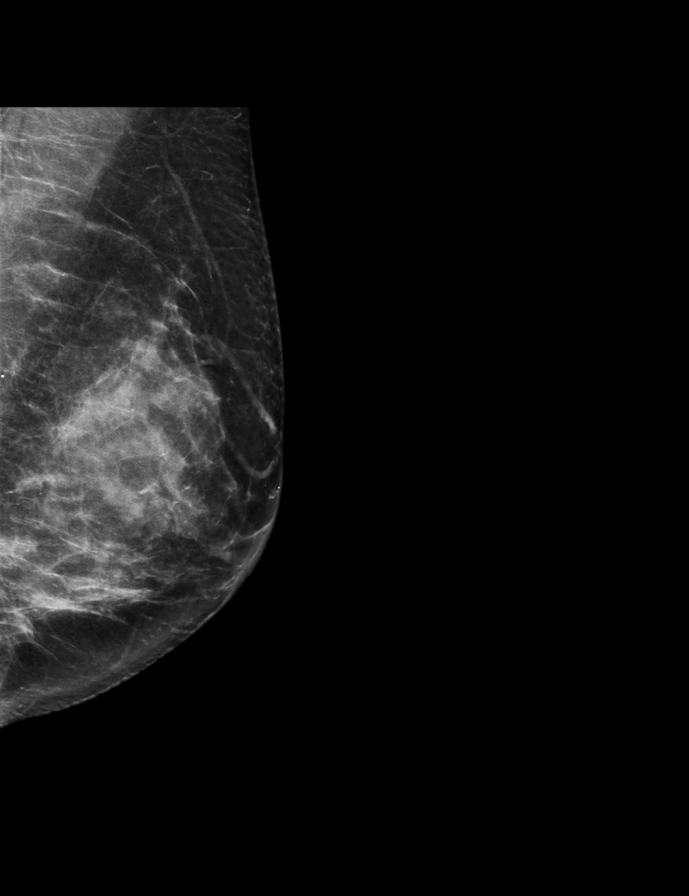

[L MLO tomo · tomo slice 35/69.0]
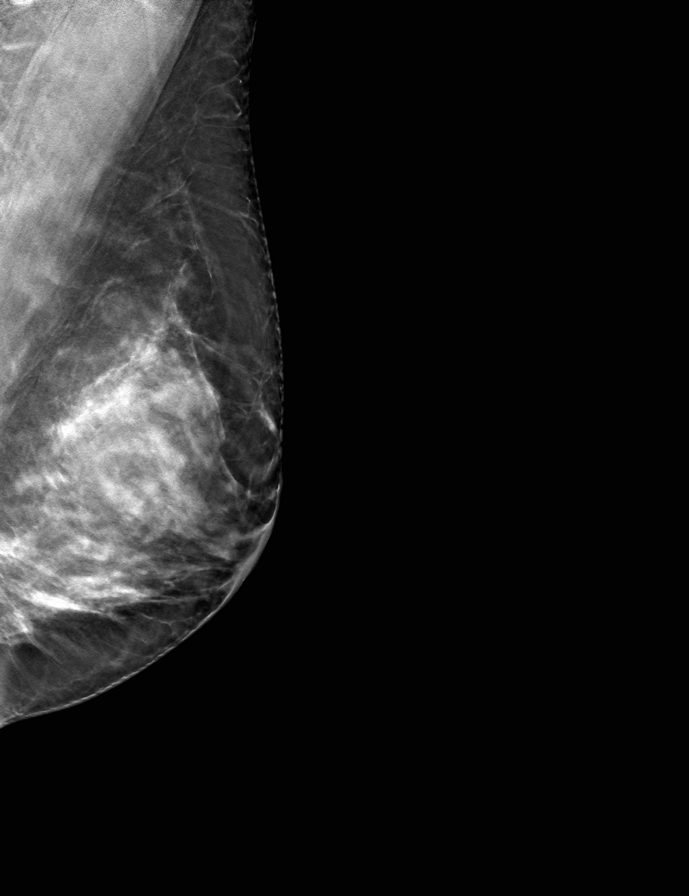

[9 of 28 positions shown; findings below may reference images not displayed]

ACR Breast Density Category d: The breast tissue is extremely dense,
which lowers the sensitivity of mammography.
FINDINGS: There are no findings suspicious for malignancy. Images were
processed with CAD.
IMPRESSION: No mammographic evidence of malignancy. A result letter of this
screening mammogram will be mailed directly to the patient.

RECOMMENDATION:
Screening mammogram in one year. (Code:[id number]-6L1)

BI-RADS CATEGORY  1: Negative.

## 2019-06-18 ENCOUNTER — Other Ambulatory Visit: Payer: Self-pay

## 2019-06-18 ENCOUNTER — Encounter: Payer: Self-pay | Admitting: Women's Health

## 2019-06-18 ENCOUNTER — Ambulatory Visit (INDEPENDENT_AMBULATORY_CARE_PROVIDER_SITE_OTHER): Payer: 59 | Admitting: Women's Health

## 2019-06-18 MED ORDER — ESTRADIOL 10 MCG VA TABS: 1.0000 | ORAL_TABLET | VAGINAL | 11 refills | Status: AC

## 2019-06-18 NOTE — Progress Notes (Signed)
Margaret Mullen 1976-03-28 [id number]    Cancelled appointment- insurance issues, will reschedule at a later time.   [place], 12:54 PM 06/18/2019

## 2019-06-18 NOTE — Patient Instructions (Addendum)
Good to see you today Vit D 2000 iu daily  Health Maintenance, Female Adopting a healthy lifestyle and getting preventive care are important in promoting health and wellness. Ask your health care provider about:  The right schedule for you to have regular tests and exams.  Things you can do on your own to prevent diseases and keep yourself healthy. What should I know about diet, weight, and exercise? Eat a healthy diet   Eat a diet that includes plenty of vegetables, fruits, low-fat dairy products, and lean protein.  Do not eat a lot of foods that are high in solid fats, added sugars, or sodium. Maintain a healthy weight Body mass index (BMI) is used to identify weight problems. It estimates body fat based on height and weight. Your health care provider can help determine your BMI and help you achieve or maintain a healthy weight. Get regular exercise Get regular exercise. This is one of the most important things you can do for your health. Most adults should:  Exercise for at least 150 minutes each week. The exercise should increase your heart rate and make you sweat (moderate-intensity exercise).  Do strengthening exercises at least twice a week. This is in addition to the moderate-intensity exercise.  Spend less time sitting. Even light physical activity can be beneficial. Watch cholesterol and blood lipids Have your blood tested for lipids and cholesterol at 44 years of age, then have this test every 5 years. Have your cholesterol levels checked more often if:  Your lipid or cholesterol levels are high.  You are older than 44 years of age.  You are at high risk for heart disease. What should I know about cancer screening? Depending on your health history and family history, you may need to have cancer screening at various ages. This may include screening for:  Breast cancer.  Cervical cancer.  Colorectal cancer.  Skin cancer.  Lung cancer. What should I know about  heart disease, diabetes, and high blood pressure? Blood pressure and heart disease  High blood pressure causes heart disease and increases the risk of stroke. This is more likely to develop in people who have high blood pressure readings, are of African descent, or are overweight.  Have your blood pressure checked: ? Every 3-5 years if you are 57-58 years of age. ? Every year if you are 15 years old or older. Diabetes Have regular diabetes screenings. This checks your fasting blood sugar level. Have the screening done:  Once every three years after age 38 if you are at a normal weight and have a low risk for diabetes.  More often and at a younger age if you are overweight or have a high risk for diabetes. What should I know about preventing infection? Hepatitis B If you have a higher risk for hepatitis B, you should be screened for this virus. Talk with your health care provider to find out if you are at risk for hepatitis B infection. Hepatitis C Testing is recommended for:  Everyone born from [birth year] through 1965.  Anyone with known risk factors for hepatitis C. Sexually transmitted infections (STIs)  Get screened for STIs, including gonorrhea and chlamydia, if: ? You are sexually active and are younger than 44 years of age. ? You are older than 44 years of age and your health care provider tells you that you are at risk for this type of infection. ? Your sexual activity has changed since you were last screened, and you are at increased  risk for chlamydia or gonorrhea. Ask your health care provider if you are at risk.  Ask your health care provider about whether you are at high risk for HIV. Your health care provider may recommend a prescription medicine to help prevent HIV infection. If you choose to take medicine to prevent HIV, you should first get tested for HIV. You should then be tested every 3 months for as long as you are taking the medicine. Pregnancy  If you are about to  stop having your period (premenopausal) and you may become pregnant, seek counseling before you get pregnant.  Take 400 to 800 micrograms (mcg) of folic acid every day if you become pregnant.  Ask for birth control (contraception) if you want to prevent pregnancy. Osteoporosis and menopause Osteoporosis is a disease in which the bones lose minerals and strength with aging. This can result in bone fractures. If you are 34 years old or older, or if you are at risk for osteoporosis and fractures, ask your health care provider if you should:  Be screened for bone loss.  Take a calcium or vitamin D supplement to lower your risk of fractures.  Be given hormone replacement therapy (HRT) to treat symptoms of menopause. Follow these instructions at home: Lifestyle  Do not use any products that contain nicotine or tobacco, such as cigarettes, e-cigarettes, and chewing tobacco. If you need help quitting, ask your health care provider.  Do not use street drugs.  Do not share needles.  Ask your health care provider for help if you need support or information about quitting drugs. Alcohol use  Do not drink alcohol if: ? Your health care provider tells you not to drink. ? You are pregnant, may be pregnant, or are planning to become pregnant.  If you drink alcohol: ? Limit how much you use to 0-1 drink a day. ? Limit intake if you are breastfeeding.  Be aware of how much alcohol is in your drink. In the [place] one drink equals one 12 oz bottle of beer (355 mL), one 5 oz glass of wine (148 mL), or one 1 oz glass of hard liquor (44 mL). General instructions  Schedule regular health, dental, and eye exams.  Stay current with your vaccines.  Tell your health care provider if: ? You often feel depressed. ? You have ever been abused or do not feel safe at home. Summary  Adopting a healthy lifestyle and getting preventive care are important in promoting health and wellness.  Follow your  health care provider's instructions about healthy diet, exercising, and getting tested or screened for diseases.  Follow your health care provider's instructions on monitoring your cholesterol and blood pressure. This information is not intended to replace advice given to you by your health care provider. Make sure you discuss any questions you have with your health care provider. Document Revised: 03/29/2018 Document Reviewed: 03/29/2018 Elsevier Patient Education  [place]. Human Papillomavirus Quadrivalent Vaccine suspension for injection What is this medicine? HUMAN PAPILLOMAVIRUS VACCINE (HYOO muhn pap uh LOH muh vahy ruhs vak SEEN) is a vaccine. It is used to prevent infections of four types of the human papillomavirus. In women, the vaccine may lower your risk of getting cervical, vaginal, vulvar, or anal cancer and genital warts. In men, the vaccine may lower your risk of getting genital warts and anal cancer. You cannot get these diseases from the vaccine. This vaccine does not treat these diseases. This medicine may be used for other purposes; ask  your health care provider or pharmacist if you have questions. COMMON BRAND NAME(S): Gardasil What should I tell my health care provider before I take this medicine? They need to know if you have any of these conditions:  fever or infection  hemophilia  HIV infection or AIDS  immune system problems  low platelet count  an unusual reaction to Human Papillomavirus Vaccine, yeast, other medicines, foods, dyes, or preservatives  pregnant or trying to get pregnant  breast-feeding How should I use this medicine? This vaccine is for injection in a muscle on your upper arm or thigh. It is given by a health care professional. Dennis Bast will be observed for 15 minutes after each dose. Sometimes, fainting happens after the vaccine is given. You may be asked to sit or lie down during the 15 minutes. Three doses are given. The second dose is  given 2 months after the first dose. The last dose is given 4 months after the second dose. A copy of a Vaccine Information Statement will be given before each vaccination. Read this sheet carefully each time. The sheet may change frequently. Talk to your pediatrician regarding the use of this medicine in children. While this drug may be prescribed for children as Collyn Selk as 17 years of age for selected conditions, precautions do apply. Overdosage: If you think you have taken too much of this medicine contact a poison control center or emergency room at once. NOTE: This medicine is only for you. Do not share this medicine with others. What if I miss a dose? All 3 doses of the vaccine should be given within 6 months. Remember to keep appointments for follow-up doses. Your health care provider will tell you when to return for the next vaccine. Ask your health care professional for advice if you are unable to keep an appointment or miss a scheduled dose. What may interact with this medicine?  other vaccines This list may not describe all possible interactions. Give your health care provider a list of all the medicines, herbs, non-prescription drugs, or dietary supplements you use. Also tell them if you smoke, drink alcohol, or use illegal drugs. Some items may interact with your medicine. What should I watch for while using this medicine? This vaccine may not fully protect everyone. Continue to have regular pelvic exams and cervical or anal cancer screenings as directed by your doctor. The Human Papillomavirus is a sexually transmitted disease. It can be passed by any kind of sexual activity that involves genital contact. The vaccine works best when given before you have any contact with the virus. Many people who have the virus do not have any signs or symptoms. Tell your doctor or health care professional if you have any reaction or unusual symptom after getting the vaccine. What side effects may I notice  from receiving this medicine? Side effects that you should report to your doctor or health care professional as soon as possible:  allergic reactions like skin rash, itching or hives, swelling of the face, lips, or tongue  breathing problems  feeling faint or lightheaded, falls Side effects that usually do not require medical attention (report to your doctor or health care professional if they continue or are bothersome):  cough  dizziness  fever  headache  nausea  redness, warmth, swelling, pain, or itching at site where injected This list may not describe all possible side effects. Call your doctor for medical advice about side effects. You may report side effects to FDA at 1-800-FDA-1088. Where  should I keep my medicine? This drug is given in a hospital or clinic and will not be stored at home. NOTE: This sheet is a summary. It may not cover all possible information. If you have questions about this medicine, talk to your doctor, pharmacist, or health care provider.  2020 Elsevier/Gold Standard (2013-05-28 13:14:33)
